# Patient Record
Sex: Male | Born: 1990
Health system: Southern US, Community
[De-identification: ages and names within clinical notes are randomized; demographics above are authoritative.]

## PROBLEM LIST (undated history)

## (undated) DIAGNOSIS — L509 Urticaria, unspecified: Secondary | ICD-10-CM

## (undated) DIAGNOSIS — J45909 Unspecified asthma, uncomplicated: Secondary | ICD-10-CM

## (undated) HISTORY — DX: Unspecified asthma, uncomplicated: J45.909

## (undated) HISTORY — DX: Urticaria, unspecified: L50.9

---

## 2018-05-15 ENCOUNTER — Encounter: Payer: Self-pay | Admitting: Family Medicine

## 2018-06-13 ENCOUNTER — Ambulatory Visit: Payer: BLUE CROSS/BLUE SHIELD | Admitting: Family Medicine

## 2018-06-13 ENCOUNTER — Encounter: Payer: Self-pay | Admitting: Family Medicine

## 2018-06-13 VITALS — BP 102/68 | HR 60 | Temp 97.9°F | Ht 66.0 in | Wt 156.0 lb

## 2018-06-13 DIAGNOSIS — Z0000 Encounter for general adult medical examination without abnormal findings: Secondary | ICD-10-CM

## 2018-06-13 DIAGNOSIS — J4520 Mild intermittent asthma, uncomplicated: Secondary | ICD-10-CM

## 2018-06-13 DIAGNOSIS — Z131 Encounter for screening for diabetes mellitus: Secondary | ICD-10-CM

## 2018-06-13 DIAGNOSIS — Z23 Encounter for immunization: Secondary | ICD-10-CM | POA: Diagnosis not present

## 2018-06-13 DIAGNOSIS — J302 Other seasonal allergic rhinitis: Secondary | ICD-10-CM | POA: Insufficient documentation

## 2018-06-13 DIAGNOSIS — Z13220 Encounter for screening for lipoid disorders: Secondary | ICD-10-CM | POA: Diagnosis not present

## 2018-06-13 LAB — CBC WITH DIFFERENTIAL/PLATELET
BASOS PCT: 1.1 % (ref 0.0–3.0)
Basophils Absolute: 0.1 10*3/uL (ref 0.0–0.1)
EOS PCT: 0.6 % (ref 0.0–5.0)
Eosinophils Absolute: 0 10*3/uL (ref 0.0–0.7)
HCT: 45.1 % (ref 39.0–52.0)
Hemoglobin: 15.2 g/dL (ref 13.0–17.0)
Lymphocytes Relative: 47.1 % — ABNORMAL HIGH (ref 12.0–46.0)
Lymphs Abs: 2.4 10*3/uL (ref 0.7–4.0)
MCHC: 33.6 g/dL (ref 30.0–36.0)
MCV: 90.7 fl (ref 78.0–100.0)
MONOS PCT: 10.5 % (ref 3.0–12.0)
Monocytes Absolute: 0.5 10*3/uL (ref 0.1–1.0)
NEUTROS ABS: 2.1 10*3/uL (ref 1.4–7.7)
Neutrophils Relative %: 40.7 % — ABNORMAL LOW (ref 43.0–77.0)
Platelets: 332 10*3/uL (ref 150.0–400.0)
RBC: 4.97 Mil/uL (ref 4.22–5.81)
RDW: 13.5 % (ref 11.5–15.5)
WBC: 5 10*3/uL (ref 4.0–10.5)

## 2018-06-13 LAB — BASIC METABOLIC PANEL
BUN: 14 mg/dL (ref 6–23)
CALCIUM: 10.1 mg/dL (ref 8.4–10.5)
CHLORIDE: 103 meq/L (ref 96–112)
CO2: 31 mEq/L (ref 19–32)
CREATININE: 1.19 mg/dL (ref 0.40–1.50)
GFR: 94.85 mL/min (ref >60.00)
Glucose, Bld: 91 mg/dL (ref 70–99)
Potassium: 4.7 mEq/L (ref 3.5–5.1)
Sodium: 140 mEq/L (ref 135–145)

## 2018-06-13 LAB — LIPID PANEL
CHOL/HDL RATIO: 3
Cholesterol: 155 mg/dL (ref 0–200)
HDL: 60.9 mg/dL (ref >39.00)
LDL Cholesterol: 86 mg/dL (ref 0–99)
NONHDL: 93.78
Triglycerides: 38 mg/dL (ref 0.0–149.0)
VLDL: 7.6 mg/dL (ref 0.0–40.0)

## 2018-06-13 LAB — HEMOGLOBIN A1C: Hgb A1c MFr Bld: 5.4 % (ref 4.6–6.5)

## 2018-06-13 NOTE — Progress Notes (Signed)
Subjective:     Charles Harris is a 27 y.o. male and is here for a comprehensive physical exam. The patient reports no problems.  Pt has a h/o asthma, well controlled, symptoms more in the spring, uses Symbicort.  Also endorses seasonal allergies, may take OTC Zyrtec.  Social hx: Pt is single.  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  Pt endorses social EtOH use.  Pt denies tobacco and drug use.  Last eye exam 12/2017.  Pt notes he had a concussion 2 years ago flag football.  Family medical history: Mom-AAW Dad-alive, diabetes, HTN MGM-alive, arthritis MGM-alive, HTN  Social History   Socioeconomic History  . Marital status: Single    Spouse name: Not on file  . Number of children: Not on file  . Years of education: Not on file  . Highest education level: Not on file  Occupational History  . Not on file  Social Needs  . Financial resource strain: Not on file  . Food insecurity:    Worry: Not on file    Inability: Not on file  . Transportation needs:    Medical: Not on file    Non-medical: Not on file  Tobacco Use  . Smoking status: Never Smoker  . Smokeless tobacco: Never Used  Substance and Sexual Activity  . Alcohol use: Yes    Frequency: Never  . Drug use: Never  . Sexual activity: Yes  Lifestyle  . Physical activity:    Days per week: Not on file    Minutes per session: Not on file  . Stress: Not on file  Relationships  . Social connections:    Talks on phone: Not on file    Gets together: Not on file    Attends religious service: Not on file    Active member of club or organization: Not on file    Attends meetings of clubs or organizations: Not on file    Relationship status: Not on file  . Intimate partner violence:    Fear of current or ex partner: Not on file    Emotionally abused: Not on file    Physically abused: Not on file    Forced sexual activity: Not on file  Other Topics Concern  . Not on file  Social  History Narrative  . Not on file   Health Maintenance  Topic Date Due  . HIV Screening  03/17/2007  . TETANUS/TDAP  03/17/2011  . INFLUENZA VACCINE  03/13/2018    The following portions of the patient's history were reviewed and updated as appropriate: allergies, current medications, past family history, past medical history, past social history, past surgical history and problem list.  Review of Systems A comprehensive review of systems was negative.   Objective:    BP 102/68 (BP Location: Left Arm, Patient Position: Sitting, Cuff Size: Large)   Pulse 60   Temp 97.9 F (36.6 C) (Oral)   Ht 5\' 6"  (1.676 m)   Wt 156 lb (70.8 kg)   SpO2 98%   BMI 25.18 kg/m  General appearance: alert, cooperative, appears stated age and no distress Head: Normocephalic, without obvious abnormality, atraumatic Eyes: conjunctivae/corneas clear. PERRL, EOM's intact. Fundi benign. Ears: normal TM's and external ear canals both ears Nose: Nares normal. Septum midline. Mucosa normal. No drainage or sinus tenderness. Throat: lips, mucosa, and tongue normal; teeth and gums normal Neck: no adenopathy, no carotid bruit, no JVD, supple, symmetrical, trachea midline and thyroid  not enlarged, symmetric, no tenderness/mass/nodules Lungs: clear to auscultation bilaterally Heart: regular rate and rhythm, S1, S2 normal, no murmur, click, rub or gallop Abdomen: soft, non-tender; bowel sounds normal; no masses,  no organomegaly Extremities: extremities normal, atraumatic, no cyanosis or edema Skin: Skin color, texture, turgor normal. No rashes or lesions Neurologic: Alert and oriented X 3, normal strength and tone. Normal symmetric reflexes. Normal coordination and gait    Assessment:    Healthy male exam.      Plan:     Anticipatory guidance given including wearing seatbelts, smoke detectors in the home, increasing physical activity, increasing p.o. intake of water and vegetables. -will obtain labs -given  handout See After Visit Summary for Counseling Recommendations    Asthma -Continue Symbicort  Seasonal allergies -Continue Zyrtec prn  Influenza vaccine given this visit.  F/u prn  Abbe Amsterdam, MD

## 2018-06-13 NOTE — Addendum Note (Signed)
Addended by: Carola Rhine on: 06/13/2018 02:59 PM   Modules accepted: Orders

## 2018-06-13 NOTE — Patient Instructions (Signed)
Preventive Care 18-39 Years, Male Preventive care refers to lifestyle choices and visits with your health care provider that can promote health and wellness. What does preventive care include?  A yearly physical exam. This is also called an annual well check.  Dental exams once or twice a year.  Routine eye exams. Ask your health care provider how often you should have your eyes checked.  Personal lifestyle choices, including: ? Daily care of your teeth and gums. ? Regular physical activity. ? Eating a healthy diet. ? Avoiding tobacco and drug use. ? Limiting alcohol use. ? Practicing safe sex. What happens during an annual well check? The services and screenings done by your health care provider during your annual well check will depend on your age, overall health, lifestyle risk factors, and family history of disease. Counseling Your health care provider may ask you questions about your:  Alcohol use.  Tobacco use.  Drug use.  Emotional well-being.  Home and relationship well-being.  Sexual activity.  Eating habits.  Work and work Statistician.  Screening You may have the following tests or measurements:  Height, weight, and BMI.  Blood pressure.  Lipid and cholesterol levels. These may be checked every 5 years starting at age 34.  Diabetes screening. This is done by checking your blood sugar (glucose) after you have not eaten for a while (fasting).  Skin check.  Hepatitis C blood test.  Hepatitis B blood test.  Sexually transmitted disease (STD) testing.  Discuss your test results, treatment options, and if necessary, the need for more tests with your health care provider. Vaccines Your health care provider may recommend certain vaccines, such as:  Influenza vaccine. This is recommended every year.  Tetanus, diphtheria, and acellular pertussis (Tdap, Td) vaccine. You may need a Td booster every 10 years.  Varicella vaccine. You may need this if you  have not been vaccinated.  HPV vaccine. If you are 23 or younger, you may need three doses over 6 months.  Measles, mumps, and rubella (MMR) vaccine. You may need at least one dose of MMR.You may also need a second dose.  Pneumococcal 13-valent conjugate (PCV13) vaccine. You may need this if you have certain conditions and have not been vaccinated.  Pneumococcal polysaccharide (PPSV23) vaccine. You may need one or two doses if you smoke cigarettes or if you have certain conditions.  Meningococcal vaccine. One dose is recommended if you are age 65-21 years and a first-year college student living in a residence hall, or if you have one of several medical conditions. You may also need additional booster doses.  Hepatitis A vaccine. You may need this if you have certain conditions or if you travel or work in places where you may be exposed to hepatitis A.  Hepatitis B vaccine. You may need this if you have certain conditions or if you travel or work in places where you may be exposed to hepatitis B.  Haemophilus influenzae type b (Hib) vaccine. You may need this if you have certain risk factors.  Talk to your health care provider about which screenings and vaccines you need and how often you need them. This information is not intended to replace advice given to you by your health care provider. Make sure you discuss any questions you have with your health care provider. Document Released: 09/25/2001 Document Revised: 04/18/2016 Document Reviewed: 05/31/2015 Elsevier Interactive Patient Education  [employer].

## 2019-01-12 ENCOUNTER — Other Ambulatory Visit: Payer: Self-pay

## 2019-01-12 ENCOUNTER — Encounter: Payer: Self-pay | Admitting: Family Medicine

## 2019-01-12 ENCOUNTER — Ambulatory Visit (INDEPENDENT_AMBULATORY_CARE_PROVIDER_SITE_OTHER): Payer: BLUE CROSS/BLUE SHIELD | Admitting: Family Medicine

## 2019-01-12 DIAGNOSIS — M79672 Pain in left foot: Secondary | ICD-10-CM

## 2019-01-12 DIAGNOSIS — R600 Localized edema: Secondary | ICD-10-CM | POA: Diagnosis not present

## 2019-01-12 NOTE — Progress Notes (Signed)
Virtual Visit via Video Note  I connected with Charles Harris on 01/12/19 at  1:30 PM EDT by a video enabled telemedicine application and verified that I am speaking with the correct person using two identifiers.  Location patient: home Location provider:work or home office Persons participating in the virtual visit: patient, provider  I discussed the limitations of evaluation and management by telemedicine and the availability of in person appointments. The patient expressed understanding and agreed to proceed.   HPI: While exercising yesterday, the metal part of an exercise/resistance band slipped and hit the top of pt's L foot.  Pt notes edema to midfoot.  Has pain radiating from area up leg.  Able to bear some weight on foot but not full weight.  Ambulating with a limp.    ROS: See pertinent positives and negatives per HPI.  Past Medical History:  Diagnosis Date  . Asthma     No past surgical history on file.  Family History  Problem Relation Age of Onset  . Diabetes Father   . Hypertension Father   . Arthritis Maternal Grandmother   . Hypertension Maternal Grandfather     SOCIAL HX: Pt is a Education officer, community.  Taking precautions at work.  States has adequate PPE.  No current outpatient medications on file.  EXAM:  VITALS per patient if applicable:  RR between 12-20 bpm  GENERAL: alert, oriented, appears well and in no acute distress  HEENT: atraumatic, conjunctiva clear, no obvious abnormalities on inspection of external nose and ears  NECK: normal movements of the head and neck  LUNGS: on inspection no signs of respiratory distress, breathing rate appears normal, no obvious gross SOB, gasping or wheezing  CV: no obvious cyanosis  MS:  TTP of L midfoot, mild edema.  Moves all visible extremities without noticeable abnormality    PSYCH/NEURO: pleasant and cooperative, no obvious depression or anxiety, speech and thought processing grossly intact  ASSESSMENT AND PLAN:   Discussed the following assessment and plan:  Left foot pain  -discussed elevation, ice, NSAIDs, rest -will obtain xray. - Plan: DG Foot Complete Left -will proceed accordingly based on imaging.  Edema of left foot  -ice - Plan: DG Foot Complete Left  F/u prn   I discussed the assessment and treatment plan with the patient. The patient was provided an opportunity to ask questions and all were answered. The patient agreed with the plan and demonstrated an understanding of the instructions.   The patient was advised to call back or seek an in-person evaluation if the symptoms worsen or if the condition fails to improve as anticipated.    Deeann Saint, MD

## 2019-01-13 ENCOUNTER — Ambulatory Visit (INDEPENDENT_AMBULATORY_CARE_PROVIDER_SITE_OTHER): Payer: BC Managed Care – PPO

## 2019-01-13 DIAGNOSIS — M25572 Pain in left ankle and joints of left foot: Secondary | ICD-10-CM | POA: Diagnosis not present

## 2019-01-13 DIAGNOSIS — M79672 Pain in left foot: Secondary | ICD-10-CM

## 2019-01-13 DIAGNOSIS — R600 Localized edema: Secondary | ICD-10-CM

## 2019-01-13 DIAGNOSIS — S99922A Unspecified injury of left foot, initial encounter: Secondary | ICD-10-CM | POA: Diagnosis not present

## 2019-01-19 DIAGNOSIS — Z20828 Contact with and (suspected) exposure to other viral communicable diseases: Secondary | ICD-10-CM | POA: Diagnosis not present

## 2019-03-18 DIAGNOSIS — Z20828 Contact with and (suspected) exposure to other viral communicable diseases: Secondary | ICD-10-CM | POA: Diagnosis not present

## 2019-06-23 ENCOUNTER — Encounter: Payer: Self-pay | Admitting: Family Medicine

## 2019-07-22 ENCOUNTER — Other Ambulatory Visit: Payer: Self-pay

## 2019-07-23 ENCOUNTER — Encounter: Payer: BC Managed Care – PPO | Admitting: Family Medicine

## 2019-11-04 ENCOUNTER — Other Ambulatory Visit: Payer: Self-pay

## 2019-11-05 ENCOUNTER — Encounter: Payer: Self-pay | Admitting: Family Medicine

## 2019-11-05 ENCOUNTER — Ambulatory Visit (INDEPENDENT_AMBULATORY_CARE_PROVIDER_SITE_OTHER): Payer: BC Managed Care – PPO | Admitting: Family Medicine

## 2019-11-05 VITALS — BP 118/80 | HR 62 | Temp 97.7°F | Wt 127.0 lb

## 2019-11-05 DIAGNOSIS — R238 Other skin changes: Secondary | ICD-10-CM

## 2019-11-05 DIAGNOSIS — Z0000 Encounter for general adult medical examination without abnormal findings: Secondary | ICD-10-CM

## 2019-11-05 DIAGNOSIS — R195 Other fecal abnormalities: Secondary | ICD-10-CM | POA: Diagnosis not present

## 2019-11-05 DIAGNOSIS — Z13220 Encounter for screening for lipoid disorders: Secondary | ICD-10-CM

## 2019-11-05 DIAGNOSIS — Z87820 Personal history of traumatic brain injury: Secondary | ICD-10-CM | POA: Diagnosis not present

## 2019-11-05 DIAGNOSIS — Z131 Encounter for screening for diabetes mellitus: Secondary | ICD-10-CM

## 2019-11-05 NOTE — Progress Notes (Signed)
Subjective:     Charles Harris is a 29 y.o. male and is here for a comprehensive physical exam. The patient reports problems - loose stools.  Pt notes intermittent episodes of loose stools.  May have 3-4 stools qod.  Pt denies changes in diet, abdominal cramping, n/v, bloating, changes in bowel with anxiety.  Eating oatmeal every morning.  Pt notes may have a smoothie with frozen fruit and vegetables.  Pt has allergies to fresh fruits-cause throat to feel itchy.    H/o concussion in 2018.  Occured while playing flag football.  States he ran into his teammate's shoulder as they were trying to go after the ball.  Pt follow-up with a neuro optometrist at the time of the incident.  Since then pt denies changes in vision, headaches, other residual effects.  Pt also mentions history of fingers changing colors in the cold.  Pt denies pain in fingers during cold temperatures, sensitivity to cold.  No recent issues.  Social History   Socioeconomic History  . Marital status: Single    Spouse name: Not on file  . Number of children: Not on file  . Years of education: Not on file  . Highest education level: Not on file  Occupational History  . Not on file  Tobacco Use  . Smoking status: Never Smoker  . Smokeless tobacco: Never Used  Substance and Sexual Activity  . Alcohol use: Yes  . Drug use: Never  . Sexual activity: Yes  Other Topics Concern  . Not on file  Social History Narrative  . Not on file   Social Determinants of Health   Financial Resource Strain:   . Difficulty of Paying Living Expenses:   Food Insecurity:   . Worried About Programme researcher, broadcasting/film/video in the Last Year:   . Barista in the Last Year:   Transportation Needs:   . Freight forwarder (Medical):   Marland Kitchen Lack of Transportation (Non-Medical):   Physical Activity:   . Days of Exercise per Week:   . Minutes of Exercise per Session:   Stress:   . Feeling of Stress :   Social Connections:   . Frequency of  Communication with Friends and Family:   . Frequency of Social Gatherings with Friends and Family:   . Attends Religious Services:   . Active Member of Clubs or Organizations:   . Attends Banker Meetings:   Marland Kitchen Marital Status:   Intimate Partner Violence:   . Fear of Current or Ex-Partner:   . Emotionally Abused:   Marland Kitchen Physically Abused:   . Sexually Abused:    Health Maintenance  Topic Date Due  . HIV Screening  Never done  . INFLUENZA VACCINE  03/14/2019  . TETANUS/TDAP  04/06/2026    The following portions of the patient's history were reviewed and updated as appropriate: allergies, current medications, past family history, past medical history, past social history, past surgical history and problem list.  Review of Systems Pertinent items noted in HPI and remainder of comprehensive ROS otherwise negative.   Objective:    BP 118/80 (BP Location: Left Arm, Patient Position: Sitting, Cuff Size: Normal)   Pulse 62   Temp 97.7 F (36.5 C) (Temporal)   Wt 127 lb (57.6 kg)   SpO2 97%   BMI 20.50 kg/m  General appearance: alert, cooperative and no distress Head: Normocephalic, without obvious abnormality, atraumatic Eyes: conjunctivae/corneas clear. PERRL, EOM's intact. Fundi benign. Ears: normal TM's and external  ear canals both ears Nose: Nares normal. Septum midline. Mucosa normal. No drainage or sinus tenderness. Throat: lips, mucosa, and tongue normal; teeth and gums normal Neck: no adenopathy, no carotid bruit, no JVD, supple, symmetrical, trachea midline and thyroid not enlarged, symmetric, no tenderness/mass/nodules Lungs: clear to auscultation bilaterally Heart: regular rate and rhythm, S1, S2 normal, no murmur, click, rub or gallop Abdomen: soft, nondistended, TTP in LUQ and LLQ, no masses or organomegaly. Extremities: extremities normal, atraumatic, no cyanosis or edema Pulses: 2+ and symmetric Skin: Skin color, texture, turgor normal. No rashes or  lesions Lymph nodes: Cervical, supraclavicular, and axillary nodes normal. Neurologic: Alert and oriented X 3, normal strength and tone. Normal symmetric reflexes. Normal coordination and gait    Assessment:    Healthy male exam with recent episodes of loose stools     Plan:     Anticipatory guidance given including wearing seatbelts, smoke detectors in the home, increasing physical activity, increasing p.o. intake of water and vegetables. -will obtain labs.  Declines STI testing. -given handout -next CPE in 1 yr See After Visit Summary for Counseling Recommendations    Loose stools  -Discussed possible causes including increased fiber intake, gluten intolerance, IBS -Discussed trying probiotic. -Discussed low FODMAP diet.  Consider decreasing intake of oatmeal every morning. -Encouraged to keep a food diary -Follow-up in 1 month, sooner if needed - Plan: TSH, T4, Free, Comprehensive metabolic panel  Screening for diabetes mellitus  - Plan: Hemoglobin A1c  Screening for cholesterol level  - Plan: Lipid panel  History of concussion -Resolved.  No residual effects noted  Change of skin color -h/o finger color change in cold without recent issues. -less likely raynaud's phenomenon as pt without a positive response to all of the following 3 questinons: sensitivity to cold, color change when exposed to cold, and fingers turning white, blue, or both.  F/u in 1 month  Grier Mitts, MD

## 2019-11-05 NOTE — Patient Instructions (Addendum)
Preventive Care 19-28 Years Old, Male Preventive care refers to lifestyle choices and visits with your health care provider that can promote health and wellness. This includes:  A yearly physical exam. This is also called an annual well check.  Regular dental and eye exams.  Immunizations.  Screening for certain conditions.  Healthy lifestyle choices, such as eating a healthy diet, getting regular exercise, not using drugs or products that contain nicotine and tobacco, and limiting alcohol use. What can I expect for my preventive care visit? Physical exam Your health care provider will check:  Height and weight. These may be used to calculate body mass index (BMI), which is a measurement that tells if you are at a healthy weight.  Heart rate and blood pressure.  Your skin for abnormal spots. Counseling Your health care provider may ask you questions about:  Alcohol, tobacco, and drug use.  Emotional well-being.  Home and relationship well-being.  Sexual activity.  Eating habits.  Work and work Statistician. What immunizations do I need?  Influenza (flu) vaccine  This is recommended every year. Tetanus, diphtheria, and pertussis (Tdap) vaccine  You may need a Td booster every 10 years. Varicella (chickenpox) vaccine  You may need this vaccine if you have not already been vaccinated. Human papillomavirus (HPV) vaccine  If recommended by your health care provider, you may need three doses over 6 months. Measles, mumps, and rubella (MMR) vaccine  You may need at least one dose of MMR. You may also need a second dose. Meningococcal conjugate (MenACWY) vaccine  One dose is recommended if you are 45-76 years old and a Market researcher living in a residence hall, or if you have one of several medical conditions. You may also need additional booster doses. Pneumococcal conjugate (PCV13) vaccine  You may need this if you have certain conditions and were not  previously vaccinated. Pneumococcal polysaccharide (PPSV23) vaccine  You may need one or two doses if you smoke cigarettes or if you have certain conditions. Hepatitis A vaccine  You may need this if you have certain conditions or if you travel or work in places where you may be exposed to hepatitis A. Hepatitis B vaccine  You may need this if you have certain conditions or if you travel or work in places where you may be exposed to hepatitis B. Haemophilus influenzae type b (Hib) vaccine  You may need this if you have certain risk factors. You may receive vaccines as individual doses or as more than one vaccine together in one shot (combination vaccines). Talk with your health care provider about the risks and benefits of combination vaccines. What tests do I need? Blood tests  Lipid and cholesterol levels. These may be checked every 5 years starting at age 17.  Hepatitis C test.  Hepatitis B test. Screening   Diabetes screening. This is done by checking your blood sugar (glucose) after you have not eaten for a while (fasting).  Sexually transmitted disease (STD) testing. Talk with your health care provider about your test results, treatment options, and if necessary, the need for more tests. Follow these instructions at home: Eating and drinking   Eat a diet that includes fresh fruits and vegetables, whole grains, lean protein, and low-fat dairy products.  Take vitamin and mineral supplements as recommended by your health care provider.  Do not drink alcohol if your health care provider tells you not to drink.  If you drink alcohol: ? Limit how much you have to 0-2  drinks a day. ? Be aware of how much alcohol is in your drink. In the [place] one drink equals one 12 oz bottle of beer (355 mL), one 5 oz glass of wine (148 mL), or one 1 oz glass of hard liquor (44 mL). Lifestyle  Take daily care of your teeth and gums.  Stay active. Exercise for at least 30 minutes on 5 or  more days each week.  Do not use any products that contain nicotine or tobacco, such as cigarettes, e-cigarettes, and chewing tobacco. If you need help quitting, ask your health care provider.  If you are sexually active, practice safe sex. Use a condom or other form of protection to prevent STIs (sexually transmitted infections). What's next?  Go to your health care provider once a year for a well check visit.  Ask your health care provider how often you should have your eyes and teeth checked.  Stay up to date on all vaccines. This information is not intended to replace advice given to you by your health care provider. Make sure you discuss any questions you have with your health care provider. Document Revised: 07/24/2018 Document Reviewed: 07/24/2018 Elsevier Patient Education  [place]  FODMAPs (fermentable oligosaccharides, disaccharides, monosaccharides, and polyols) are sugars that are hard for some people to digest. A low-FODMAP eating plan may help some people who have bowel (intestinal) diseases to manage their symptoms. This meal plan can be complicated to follow. Work with a diet and nutrition specialist (dietitian) to make a low-FODMAP eating plan that is right for you. A dietitian can make sure that you get enough nutrition from this diet. What are tips for following this plan? Reading food labels  Check labels for hidden FODMAPs such as: ? High-fructose syrup. ? Honey. ? Agave. ? Natural fruit flavors. ? Onion or garlic powder.  Choose low-FODMAP foods that contain 3-4 grams of fiber per serving.  Check food labels for serving sizes. Eat only one serving at a time to make sure FODMAP levels stay low. Meal planning  Follow a low-FODMAP eating plan for up to 6 weeks, or as told by your health care provider or dietitian.  To follow the eating plan: 1. Eliminate high-FODMAP foods from your diet completely. 2. Gradually reintroduce  high-FODMAP foods into your diet one at a time. Most people should wait a few days after introducing one high-FODMAP food before they introduce the next high-FODMAP food. Your dietitian can recommend how quickly you may reintroduce foods. 3. Keep a daily record of what you eat and drink, and make note of any symptoms that you have after eating. 4. Review your daily record with a dietitian regularly. Your dietitian can help you identify which foods you can eat and which foods you should avoid. General tips  Drink enough fluid each day to keep your urine pale yellow.  Avoid processed foods. These often have added sugar and may be high in FODMAPs.  Avoid most dairy products, whole grains, and sweeteners.  Work with a dietitian to make sure you get enough fiber in your diet. Recommended foods Grains  Gluten-free grains, such as rice, oats, buckwheat, quinoa, corn, polenta, and millet. Gluten-free pasta, bread, or cereal. Rice noodles. Corn tortillas. Vegetables  Eggplant, zucchini, cucumber, peppers, green beans, Brussels sprouts, bean sprouts, lettuce, arugula, kale, Swiss chard, spinach, collard greens, bok choy, summer squash, potato, and tomato. Limited amounts of corn, carrot, and sweet potato. Green parts of scallions. Fruits  Bananas,  oranges, lemons, limes, blueberries, raspberries, strawberries, grapes, cantaloupe, honeydew melon, kiwi, papaya, passion fruit, and pineapple. Limited amounts of dried cranberries, banana chips, and shredded coconut. Dairy  Lactose-free milk, yogurt, and kefir. Lactose-free cottage cheese and ice cream. Non-dairy milks, such as almond, coconut, hemp, and rice milk. Yogurts made of non-dairy milks. Limited amounts of goat cheese, brie, mozzarella, parmesan, swiss, and other hard cheeses. Meats and other protein foods  Unseasoned beef, pork, poultry, or fish. Eggs. Berniece Salines. Tofu (firm) and tempeh. Limited amounts of nuts and seeds, such as almonds, walnuts,  [place] nuts, pecans, peanuts, pumpkin seeds, chia seeds, and sunflower seeds. Fats and oils  Butter-free spreads. Vegetable oils, such as olive, canola, and sunflower oil. Seasoning and other foods  Artificial sweeteners with names that do not end in "ol" such as aspartame, saccharine, and stevia. Maple syrup, white table sugar, raw sugar, brown sugar, and molasses. Fresh basil, coriander, parsley, rosemary, and thyme. Beverages  Water and mineral water. Sugar-sweetened soft drinks. Small amounts of orange juice or cranberry juice. Black and green tea. Most dry wines. Coffee. This may not be a complete list of low-FODMAP foods. Talk with your dietitian for more information. Foods to avoid Grains  Wheat, including kamut, durum, and semolina. Barley and bulgur. Couscous. Wheat-based cereals. Wheat noodles, bread, crackers, and pastries. Vegetables  Chicory root, artichoke, asparagus, cabbage, snow peas, sugar snap peas, mushrooms, and cauliflower. Onions, garlic, leeks, and the white part of scallions. Fruits  Fresh, dried, and juiced forms of apple, pear, watermelon, peach, plum, cherries, apricots, blackberries, boysenberries, figs, nectarines, and mango. Avocado. Dairy  Milk, yogurt, ice cream, and soft cheese. Cream and sour cream. Milk-based sauces. Custard. Meats and other protein foods  Fried or fatty meat. Sausage. Cashews and pistachios. Soybeans, baked beans, black beans, chickpeas, kidney beans, fava beans, navy beans, lentils, and split peas. Seasoning and other foods  Any sugar-free gum or candy. Foods that contain artificial sweeteners such as sorbitol, mannitol, isomalt, or xylitol. Foods that contain honey, high-fructose corn syrup, or agave. Bouillon, vegetable stock, beef stock, and chicken stock. Garlic and onion powder. Condiments made with onion, such as hummus, chutney, pickles, relish, salad dressing, and salsa. Tomato paste. Beverages  Chicory-based drinks.  Coffee substitutes. Chamomile tea. Fennel tea. Sweet or fortified wines such as port or sherry. Diet soft drinks made with isomalt, mannitol, maltitol, sorbitol, or xylitol. Apple, pear, and mango juice. Juices with high-fructose corn syrup. This may not be a complete list of high-FODMAP foods. Talk with your dietitian to discuss what dietary choices are best for you.  Summary  A low-FODMAP eating plan is a short-term diet that eliminates FODMAPs from your diet to help ease symptoms of certain bowel diseases.  The eating plan usually lasts up to 6 weeks. After that, high-FODMAP foods are restarted gradually, one at a time, so you can find out which may be causing symptoms.  A low-FODMAP eating plan can be complicated. It is best to work with a dietitian who has experience with this type of plan. This information is not intended to replace advice given to you by your health care provider. Make sure you discuss any questions you have with your health care provider. Document Revised: 07/12/2017 Document Reviewed: 03/26/2017 Elsevier Patient Education  2020 [place] Allergy A food allergy is an abnormal reaction to a food (food allergen) by the body's defense system (immune system). Foods that commonly cause allergies include:  Milk.  Seafood.  Eggs.  Peanuts.  Tree nuts such as pecans, walnuts, and cashews.  Wheat.  Soy. What are the causes? Food allergies happen when the immune system sees a food as harmful and releases chemicals (antibodies) to fight it. What are the signs or symptoms? Symptoms may be mild or severe. They usually start minutes after eating the food, but they can occur even a few hours later. In people with a severe allergy, symptoms can start within seconds. Mild symptoms of this condition include:  Congested nose.  Tingling in the mouth.  An itchy, red rash.  Vomiting.  Diarrhea. In people with a severe allergy, a life-threatening reaction can  occur called anaphylaxis. Get help right away if you have symptoms of anaphylaxis, such as:  Feeling warm in the face (flushed). This may include redness.  Itchy, red, swollen areas of skin (hives).  Swelling of the eyes, lips, face, mouth, tongue, or throat.  Difficulty breathing, speaking, or swallowing.  Noisy breathing (wheezing).  Dizziness or light-headedness.  Fainting.  Pain or cramping in the abdomen. How is this diagnosed? This condition may be diagnosed based on:  A physical exam.  Your medical history.  Skin tests.  Blood tests.  A food challenge test. This test involves eating the food that may be causing the allergic response while being monitored for a reaction by your health care provider.  The results of an elimination diet. The elimination diet involves removing foods from your diet and then adding them back in, one at a time.  A food diary. How is this treated? There is no cure for food allergies. Treatment focuses on preventing exposure to the food or foods you are allergic to and treating reactions if you are exposed to the food. Mild symptoms may not need treatment.  Severe reactions usually need to be treated at a hospital. Treatment may include:  Medicines that help: ? Tighten your blood vessels (epinephrine). ? Relieve itching and hives (antihistamines). ? Widen the narrow and tight airways (bronchodilators). ? Reduce swelling (corticosteroids).  Oxygen therapy to help you breathe.  IV fluids to keep you hydrated. After a severe reaction, you may be given rescue medicines, such as:  An anaphylaxis kit.  An epinephrine injection, commonly called an auto-injector "pen" (pre-filled automatic epinephrine injection device). Your health care provider may teach you how to use these if you are accidentally exposed to an allergen. Follow these instructions at home: If you have a potential allergy:  Follow the elimination diet as told by your  health care provider.  Keep a food diary as told by your health care provider. Every day, write down: ? What you eat and drink and when. ? What symptoms you have and when. If you have a severe allergy:   Wear a medical alert bracelet or necklace that describes your allergy.  Carry your anaphylaxis kit or an auto-injector pen with you at all times. Use them as told by your health care provider.  Make sure that you, your family members, and your employer know: ? The signs of anaphylaxis. ? How to use an anaphylaxis kit. ? How to use an auto-injector pen.  If you think that you are having an anaphylactic reaction, use your auto-injector pen or anaphylaxis kit.  Replace your auto-injector pen immediately after use, in case you have another reaction.  Get medical care after use your auto-injector pen. This is important because you can have a delayed, life-threatening reaction after taking the medicine (rebound anaphylaxis). General instructions  Avoid the foods that  you are allergic to.  Read food labels before you eat packaged items. Look for ingredients that you are allergic to.  When you are at a restaurant, tell your server that you have an allergy. If you are unsure whether a meal has an ingredient that you are allergic to, ask your server.  Take over-the-counter and prescription medicines only as told by your health care provider. ? Do not drive until the medicine has worn off, unless your health care provider gives you approval.  Inform all health care providers that you have a food allergy.  If you think that you might be allergic to something else, talk with your health care provider. Do not eat a food to see if you are allergic to it without talking with your health care provider first. Contact a health care provider if you:  Have symptoms that do not go away within 2 days.  Have symptoms that get worse.  Have new symptoms. Get help right away if you have symptoms of  anaphylaxis:  Flushed skin.  Hives.  Swelling of the eyes, lips, face, mouth, tongue, or throat.  Difficulty breathing, speaking, or swallowing.  Wheezing.  Dizziness or light-headedness.  Fainting.  Pain or cramping in the abdomen. These symptoms may represent a serious problem that is an emergency. Do not wait to see if the symptoms will go away. Use your auto-injector pen or anaphylaxis kit as you have been told. Get medical help right away. Call your local emergency services (911 in the [place] Do not drive yourself to the hospital. If you needed to use an auto-injector pen, you need more medical care even if the medicine seems to be helping. This is important because anaphylaxis may happen again within 72 hours. Summary  A food allergy is an abnormal reaction to a food (food allergen) by the body's defense system (immune system).  There is no cure for food allergies. Treatment focuses on preventing exposure to the food or foods you are allergic to and treating reactions if you are exposed to the food.  Wear a medical alert bracelet or necklace that describes your allergy.  If you have symptoms of anaphylaxis, use your auto-injector pen or anaphylaxis kit as you have been instructed, and get medical help right away. This information is not intended to replace advice given to you by your health care provider. Make sure you discuss any questions you have with your health care provider. Document Revised: 07/30/2017 Document Reviewed: 07/30/2017 Elsevier Patient Education  [place].

## 2019-11-06 DIAGNOSIS — Z87820 Personal history of traumatic brain injury: Secondary | ICD-10-CM | POA: Insufficient documentation

## 2019-11-06 LAB — COMPREHENSIVE METABOLIC PANEL
ALT: 12 U/L (ref 0–53)
AST: 20 U/L (ref 0–37)
Albumin: 4.7 g/dL (ref 3.5–5.2)
Alkaline Phosphatase: 71 U/L (ref 39–117)
BUN: 11 mg/dL (ref 6–23)
CO2: 31 mEq/L (ref 19–32)
Calcium: 10.1 mg/dL (ref 8.4–10.5)
Chloride: 101 mEq/L (ref 96–112)
Creatinine, Ser: 1.11 mg/dL (ref 0.40–1.50)
GFR: 95.69 mL/min (ref >60.00)
Glucose, Bld: 86 mg/dL (ref 70–99)
Potassium: 4.3 mEq/L (ref 3.5–5.1)
Sodium: 136 mEq/L (ref 135–145)
Total Bilirubin: 0.8 mg/dL (ref 0.2–1.2)
Total Protein: 7.4 g/dL (ref 6.0–8.3)

## 2019-11-06 LAB — CBC WITH DIFFERENTIAL/PLATELET
Basophils Absolute: 0 10*3/uL (ref 0.0–0.1)
Basophils Relative: 0.7 % (ref 0.0–3.0)
Eosinophils Absolute: 0 10*3/uL (ref 0.0–0.7)
Eosinophils Relative: 0.9 % (ref 0.0–5.0)
HCT: 43.5 % (ref 39.0–52.0)
Hemoglobin: 14.9 g/dL (ref 13.0–17.0)
Lymphocytes Relative: 46.7 % — ABNORMAL HIGH (ref 12.0–46.0)
Lymphs Abs: 2.3 10*3/uL (ref 0.7–4.0)
MCHC: 34.3 g/dL (ref 30.0–36.0)
MCV: 90.9 fl (ref 78.0–100.0)
Monocytes Absolute: 0.5 10*3/uL (ref 0.1–1.0)
Monocytes Relative: 10.2 % (ref 3.0–12.0)
Neutro Abs: 2 10*3/uL (ref 1.4–7.7)
Neutrophils Relative %: 41.5 % — ABNORMAL LOW (ref 43.0–77.0)
Platelets: 295 10*3/uL (ref 150.0–400.0)
RBC: 4.79 Mil/uL (ref 4.22–5.81)
RDW: 13.4 % (ref 11.5–15.5)
WBC: 4.9 10*3/uL (ref 4.0–10.5)

## 2019-11-06 LAB — LIPID PANEL
Cholesterol: 160 mg/dL (ref 0–200)
HDL: 59.2 mg/dL (ref >39.00)
LDL Cholesterol: 92 mg/dL (ref 0–99)
NonHDL: 101.2
Total CHOL/HDL Ratio: 3
Triglycerides: 48 mg/dL (ref 0.0–149.0)
VLDL: 9.6 mg/dL (ref 0.0–40.0)

## 2019-11-06 LAB — HEMOGLOBIN A1C: Hgb A1c MFr Bld: 5.4 % (ref 4.6–6.5)

## 2019-11-06 LAB — T4, FREE: Free T4: 0.92 ng/dL (ref 0.60–1.60)

## 2019-11-06 LAB — TSH: TSH: 0.95 u[IU]/mL (ref 0.35–4.50)

## 2020-01-27 ENCOUNTER — Other Ambulatory Visit: Payer: Self-pay

## 2020-01-28 ENCOUNTER — Encounter: Payer: Self-pay | Admitting: Family Medicine

## 2020-01-28 ENCOUNTER — Ambulatory Visit (INDEPENDENT_AMBULATORY_CARE_PROVIDER_SITE_OTHER): Payer: BC Managed Care – PPO | Admitting: Family Medicine

## 2020-01-28 VITALS — BP 120/78 | HR 64 | Temp 98.2°F | Wt 152.0 lb

## 2020-01-28 DIAGNOSIS — G5602 Carpal tunnel syndrome, left upper limb: Secondary | ICD-10-CM | POA: Diagnosis not present

## 2020-01-28 MED ORDER — PREDNISONE 10 MG PO TABS: ORAL_TABLET | ORAL | 0 refills | Status: DC

## 2020-01-28 NOTE — Progress Notes (Signed)
Subjective:    Patient ID: Charles Harris, male    DOB: September 03, 1990, 29 y.o.   MRN: [id number]  No chief complaint on file.   HPI Patient was seen today for ongoing concern.  Pt endorse L wrist pain with numbness and tingling in L hand x 3 wks.  Pt is a Education officer, community. endorses repetitive motions at work including typing and while working out.  Pt denies edema or erythema of hand or wrist.  Pt tried ibuprofen and a wrist splint.  Pt is L handed.  Past Medical History:  Diagnosis Date  . Asthma     Allergies  Allergen Reactions  . Fruit & Vegetable Daily [Nutritional Supplements]     SOME FRUITS, STRAWBERRY,BANANAS, APPLES    ROS General: Denies fever, chills, night sweats, changes in weight, changes in appetite HEENT: Denies headaches, ear pain, changes in vision, rhinorrhea, sore throat CV: Denies CP, palpitations, SOB, orthopnea Pulm: Denies SOB, cough, wheezing GI: Denies abdominal pain, nausea, vomiting, diarrhea, constipation GU: Denies dysuria, hematuria, frequency, vaginal discharge Msk: Denies muscle cramps, joint pains  +L wrist pain Neuro: Denies weakness   +numbness, tingling in L hand  Skin: Denies rashes, bruising Psych: Denies depression, anxiety, hallucinations      Objective:    Blood pressure 120/78, pulse 64, temperature 98.2 F (36.8 C), temperature source Temporal, weight 152 lb (68.9 kg), SpO2 98 %.  Gen. Pleasant, well-nourished, in no distress, normal affect   HEENT: [place]/AT, face symmetric, no scleral icterus, PERRLA, EOMI, nares patent without drainage Lungs: no accessory muscle use, CTAB, no wheezes or rales Cardiovascular: RRR, no peripheral edema Musculoskeletal: TTP of L wrist.  +Tinnel's and Phalen's of L wrist.  Tapping on b/l medial and lateral epicondyles causes numbness and tingling in hand.  No deformities, no cyanosis or clubbing, normal tone.  Grip strength 5/5, but causes discomfort. Neuro:  A&Ox3, CN II-XII intact, normal gait Skin:  Warm, no  lesions/ rash  Wt Readings from Last 3 Encounters:  11/05/19 127 lb (57.6 kg)  06/13/18 156 lb (70.8 kg)    Lab Results  Component Value Date   WBC 4.9 11/05/2019   HGB 14.9 11/05/2019   HCT 43.5 11/05/2019   PLT 295.0 11/05/2019   GLUCOSE 86 11/05/2019   CHOL 160 11/05/2019   TRIG 48.0 11/05/2019   HDL 59.20 11/05/2019   LDLCALC 92 11/05/2019   ALT 12 11/05/2019   AST 20 11/05/2019   NA 136 11/05/2019   K 4.3 11/05/2019   CL 101 11/05/2019   CREATININE 1.11 11/05/2019   BUN 11 11/05/2019   CO2 31 11/05/2019   TSH 0.95 11/05/2019   HGBA1C 5.4 11/05/2019    Assessment/Plan:  Carpal tunnel syndrome of left wrist  -discussed supportive care wrist exercises, NSAIDs, ice, vitamin B6, Biofreeze, Tiger balm, wrist splint at night, and ergonomic modifications to workspace -Discussed further treatments including steroid injection and carpal tunnel release -Also discussed EMG/NCS.  Pt wishes to wait at this time -Given handouts -We will start prednisone taper - Plan: predniSONE (DELTASONE) 10 MG tablet  F/u prn  Abbe [place], MD

## 2020-01-28 NOTE — Patient Instructions (Addendum)
Ice, vitamin B6 500 mg daily, wearing the wrist splint, and exercises can help with carpal tunnel.   Carpal Tunnel Syndrome  Carpal tunnel syndrome is a condition that causes pain in your hand and arm. The carpal tunnel is a narrow area located on the palm side of your wrist. Repeated wrist motion or certain diseases may cause swelling within the tunnel. This swelling pinches the main nerve in the wrist (median nerve). What are the causes? This condition may be caused by:  Repeated wrist motions.  Wrist injuries.  Arthritis.  A cyst or tumor in the carpal tunnel.  Fluid buildup during pregnancy. Sometimes the cause of this condition is not known. What increases the risk? The following factors may make you more likely to develop this condition:  Having a job, such as being a Haematologist, that requires you to repeatedly move your wrist in the same motion.  Being a woman.  Having certain conditions, such as: ? Diabetes. ? Obesity. ? An underactive thyroid (hypothyroidism). ? Kidney failure. What are the signs or symptoms? Symptoms of this condition include:  A tingling feeling in your fingers, especially in your thumb, index, and middle fingers.  Tingling or numbness in your hand.  An aching feeling in your entire arm, especially when your wrist and elbow are bent for a long time.  Wrist pain that goes up your arm to your shoulder.  Pain that goes down into your palm or fingers.  A weak feeling in your hands. You may have trouble grabbing and holding items. Your symptoms may feel worse during the night. How is this diagnosed? This condition is diagnosed with a medical history and physical exam. You may also have tests, including:  Electromyogram (EMG). This test measures electrical signals sent by your nerves into the muscles.  Nerve conduction study. This test measures how well electrical signals pass through your nerves.  Imaging tests, such as X-rays,  ultrasound, and MRI. These tests check for possible causes of your condition. How is this treated? This condition may be treated with:  Lifestyle changes. It is important to stop or change the activity that caused your condition.  Doing exercise and activities to strengthen your muscles and bones (physical therapy).  Learning how to use your hand again after diagnosis (occupational therapy).  Medicines for pain and inflammation. This may include medicine that is injected into your wrist.  A wrist splint.  Surgery. Follow these instructions at home: If you have a splint:  Wear the splint as told by your health care provider. Remove it only as told by your health care provider.  Loosen the splint if your fingers tingle, become numb, or turn cold and blue.  Keep the splint clean.  If the splint is not waterproof: ? Do not let it get wet. ? Cover it with a watertight covering when you take a bath or shower. Managing pain, stiffness, and swelling   If directed, put ice on the painful area: ? If you have a removable splint, remove it as told by your health care provider. ? Put ice in a plastic bag. ? Place a towel between your skin and the bag. ? Leave the ice on for 20 minutes, 2-3 times per day. General instructions  Take over-the-counter and prescription medicines only as told by your health care provider.  Rest your wrist from any activity that may be causing your pain. If your condition is work related, talk with your employer about changes that  can be made, such as getting a wrist pad to use while typing.  Do any exercises as told by your health care provider, physical therapist, or occupational therapist.  Keep all follow-up visits as told by your health care provider. This is important. Contact a health care provider if:  You have new symptoms.  Your pain is not controlled with medicines.  Your symptoms get worse. Get help right away if:  You have severe  numbness or tingling in your wrist or hand. Summary  Carpal tunnel syndrome is a condition that causes pain in your hand and arm.  It is usually caused by repeated wrist motions.  Lifestyle changes and medicines are used to treat carpal tunnel syndrome. Surgery may be recommended.  Follow your health care provider's instructions about wearing a splint, resting from activity, keeping follow-up visits, and calling for help. This information is not intended to replace advice given to you by your health care provider. Make sure you discuss any questions you have with your health care provider. Document Revised: 12/06/2017 Document Reviewed: 12/06/2017 Elsevier Patient Education  2020 [employer]  Preventing Carpal Tunnel Syndrome  Carpal tunnel syndrome is a condition that causes pain, numbness, and weakness in the wrist, hand, and fingers. The carpal tunnel is a narrow, hollow space in the wrist. Tendons and one of the main nerves in the hand (median nerve) pass through the carpal tunnel. The median nerve supplies feeling to the thumb and the first three fingers. It also supplies the muscles at the base of the thumb. Carpal tunnel syndrome happens when the median nerve gets squeezed in the area where it passes through the carpal tunnel. In some cases, it may not be possible to prevent carpal tunnel syndrome. However, you can take steps to relieve pressure on your wrist and reduce your risk of developing this condition. How can this condition affect me? Carpal tunnel syndrome can affect your ability to do jobs or activities that involve hand, wrist, and finger action. It can cause symptoms such as:  Pain in the wrist, hand, and fingers.  Burning, tingling, or numbness in the affected area.  A weak feeling in your hands. You may have trouble grabbing and holding items. Symptoms may get worse over time. For some people, symptoms get worse at night. What can increase my risk? The following  factors may make you more likely to develop this condition:  Having a job that requires you to repeatedly move your wrist or requires you to use tools that vibrate. This may include jobs that involve using computers, working on an [employer], or working with power tools such as Radiographer, therapeutic.  Being a woman.  Having a family history of the condition.  Having certain conditions, such as: ? Diabetes. ? Pregnancy. ? Obesity. ? Thyroid disease. ? Rheumatoid arthritis. What actions can I take to help prevent this condition?      Avoid making repetitive hand and wrist motions that cause your wrist to get stiff or painful.  Take frequent breaks if you use your hands and wrists for many hours at a time.  Stretch your hands and fingers often to get blood flowing and relieve tension.  Keep your wrists in the natural position when using a computer keyboard or mouse. Do not bend your wrists downward or sideways.  If you use your hands and wrists for many hours at work, make changes to your work space to ease pressure on your wrists. You may want to  use: ? A padded wrist rest for computer work. ? A slanted computer keyboard. ? Hand tools with padded handles to reduce vibrations.  Consider wearing a wrist brace. This will not prevent carpal tunnel syndrome but may keep it from getting worse. A wrist brace reduces bending and stress.  Closely manage any medical conditions you have that can put you at risk for carpal tunnel syndrome. Have your blood sugar checked to make sure you are not developing diabetes. If you have diabetes, work with your health care provider to keep your blood sugar under control. Where to find more information  [employer] [facility] Neu[facility] Disorders and Stroke: [email or web address]  [place] [facility]: [email or web address] Contact a health care provider if:  You have numbness or tingling in your wrist, hand, or fingers.  You have pain or a  burning sensation in your wrist, hand, or fingers.  Pain, tingling, or burning wakes you up at night.  Your hand becomes weak and clumsy.  You frequently drop objects.  You are unable to use your wrists and hands without pain. Summary  Carpal tunnel syndrome is a condition that causes pain, numbness, and weakness in the wrist, hand, and fingers.  You can take steps to relieve pressure on your wrist and reduce your risk of developing this condition.  Avoid making repetitive hand and wrist motions that cause your wrist to get stiff or painful.  If you use your hands and wrists for many hours at work, you may want to make changes to your work space to ease pressure on your wrists.  Take frequent breaks to stretch your hands and fingers. This information is not intended to replace advice given to you by your health care provider. Make sure you discuss any questions you have with your health care provider. Document Revised: 12/12/2017 Document Reviewed: 12/12/2017 Elsevier Patient Education  [place].

## 2020-02-10 DIAGNOSIS — Z20822 Contact with and (suspected) exposure to covid-19: Secondary | ICD-10-CM | POA: Diagnosis not present

## 2020-03-29 DIAGNOSIS — Z20828 Contact with and (suspected) exposure to other viral communicable diseases: Secondary | ICD-10-CM | POA: Diagnosis not present

## 2020-04-09 DIAGNOSIS — Z03818 Encounter for observation for suspected exposure to other biological agents ruled out: Secondary | ICD-10-CM | POA: Diagnosis not present

## 2020-04-26 ENCOUNTER — Other Ambulatory Visit: Payer: Self-pay

## 2020-04-26 ENCOUNTER — Other Ambulatory Visit: Payer: BC Managed Care – PPO

## 2020-04-26 DIAGNOSIS — Z20822 Contact with and (suspected) exposure to covid-19: Secondary | ICD-10-CM

## 2020-04-28 LAB — SARS-COV-2, NAA 2 DAY TAT

## 2020-04-28 LAB — NOVEL CORONAVIRUS, NAA: SARS-CoV-2, NAA: NOT DETECTED

## 2020-11-07 ENCOUNTER — Encounter: Payer: BC Managed Care – PPO | Admitting: Family Medicine

## 2020-11-11 ENCOUNTER — Encounter: Payer: BC Managed Care – PPO | Admitting: Family Medicine

## 2020-11-18 ENCOUNTER — Other Ambulatory Visit: Payer: Self-pay

## 2020-11-18 ENCOUNTER — Encounter: Payer: Self-pay | Admitting: Family Medicine

## 2020-11-18 ENCOUNTER — Ambulatory Visit (INDEPENDENT_AMBULATORY_CARE_PROVIDER_SITE_OTHER): Payer: 59 | Admitting: Family Medicine

## 2020-11-18 VITALS — BP 120/86 | HR 66 | Temp 98.4°F | Ht 67.0 in | Wt 158.8 lb

## 2020-11-18 DIAGNOSIS — M25532 Pain in left wrist: Secondary | ICD-10-CM | POA: Diagnosis not present

## 2020-11-18 DIAGNOSIS — E559 Vitamin D deficiency, unspecified: Secondary | ICD-10-CM

## 2020-11-18 DIAGNOSIS — Z0000 Encounter for general adult medical examination without abnormal findings: Secondary | ICD-10-CM | POA: Diagnosis not present

## 2020-11-18 DIAGNOSIS — R1032 Left lower quadrant pain: Secondary | ICD-10-CM

## 2020-11-18 DIAGNOSIS — G5602 Carpal tunnel syndrome, left upper limb: Secondary | ICD-10-CM

## 2020-11-18 DIAGNOSIS — R0683 Snoring: Secondary | ICD-10-CM

## 2020-11-18 DIAGNOSIS — R400 Somnolence: Secondary | ICD-10-CM | POA: Diagnosis not present

## 2020-11-18 DIAGNOSIS — Z91018 Allergy to other foods: Secondary | ICD-10-CM

## 2020-11-18 LAB — LIPID PANEL
Cholesterol: 180 mg/dL (ref 0–200)
HDL: 72.5 mg/dL (ref >39.00)
LDL Cholesterol: 98 mg/dL (ref 0–99)
NonHDL: 107.46
Total CHOL/HDL Ratio: 2
Triglycerides: 49 mg/dL (ref 0.0–149.0)
VLDL: 9.8 mg/dL (ref 0.0–40.0)

## 2020-11-18 LAB — HEMOGLOBIN A1C: Hgb A1c MFr Bld: 5.4 % (ref 4.6–6.5)

## 2020-11-18 LAB — COMPREHENSIVE METABOLIC PANEL
ALT: 11 U/L (ref 0–53)
AST: 17 U/L (ref 0–37)
Albumin: 4.8 g/dL (ref 3.5–5.2)
Alkaline Phosphatase: 65 U/L (ref 39–117)
BUN: 11 mg/dL (ref 6–23)
CO2: 31 mEq/L (ref 19–32)
Calcium: 10.3 mg/dL (ref 8.4–10.5)
Chloride: 100 mEq/L (ref 96–112)
Creatinine, Ser: 1.13 mg/dL (ref 0.40–1.50)
GFR: 88.35 mL/min (ref >60.00)
Glucose, Bld: 85 mg/dL (ref 70–99)
Potassium: 4.5 mEq/L (ref 3.5–5.1)
Sodium: 136 mEq/L (ref 135–145)
Total Bilirubin: 0.8 mg/dL (ref 0.2–1.2)
Total Protein: 7.6 g/dL (ref 6.0–8.3)

## 2020-11-18 LAB — CBC WITH DIFFERENTIAL/PLATELET
Basophils Absolute: 0 10*3/uL (ref 0.0–0.1)
Basophils Relative: 0.7 % (ref 0.0–3.0)
Eosinophils Absolute: 0 10*3/uL (ref 0.0–0.7)
Eosinophils Relative: 0.6 % (ref 0.0–5.0)
HCT: 47.6 % (ref 39.0–52.0)
Hemoglobin: 16 g/dL (ref 13.0–17.0)
Lymphocytes Relative: 37.3 % (ref 12.0–46.0)
Lymphs Abs: 1.5 10*3/uL (ref 0.7–4.0)
MCHC: 33.5 g/dL (ref 30.0–36.0)
MCV: 91.1 fl (ref 78.0–100.0)
Monocytes Absolute: 0.4 10*3/uL (ref 0.1–1.0)
Monocytes Relative: 10.8 % (ref 3.0–12.0)
Neutro Abs: 2 10*3/uL (ref 1.4–7.7)
Neutrophils Relative %: 50.6 % (ref 43.0–77.0)
Platelets: 292 10*3/uL (ref 150.0–400.0)
RBC: 5.23 Mil/uL (ref 4.22–5.81)
RDW: 13.2 % (ref 11.5–15.5)
WBC: 4 10*3/uL (ref 4.0–10.5)

## 2020-11-18 LAB — VITAMIN B12: Vitamin B-12: 401 pg/mL (ref 211–911)

## 2020-11-18 LAB — VITAMIN D 25 HYDROXY (VIT D DEFICIENCY, FRACTURES): VITD: 17.25 ng/mL — ABNORMAL LOW (ref 30.00–100.00)

## 2020-11-18 LAB — TSH: TSH: 0.64 u[IU]/mL (ref 0.35–4.50)

## 2020-11-18 LAB — T4, FREE: Free T4: 0.82 ng/dL (ref 0.60–1.60)

## 2020-11-18 MED ORDER — VITAMIN D (ERGOCALCIFEROL) 1.25 MG (50000 UNIT) PO CAPS: 50000.0000 [IU] | ORAL_CAPSULE | ORAL | 0 refills | Status: DC

## 2020-11-18 NOTE — Progress Notes (Addendum)
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 The patient reports several concerns.  Pt with continued intermittent left wrist pain with radiation into forearm.  Denies current numbness or tingling in hand or fingers.  Patient is left-handed. Is as a Education officer, community.  Noted more after working out.  Patient stopped working out and noticed pain resolved.  Tiger balm helps some. Discomfort does not wake patient up from sleep.  Patient notes waking up feeling unrested.  Goes to bed at 930 p.m. and wakes up at 4:30 AM.  Patient endorses being told he snores and daytime somnolence.  Has fallen asleep at stoplights.  Inquires about desensitization for multiple food allergies.  Patient has allergies to fresh fruits and vegetables, develops itchy throat.  No issues if items are cooked.  Pt mentions h/o stable testicular mass.  Previously followed by u/s.  Social History   Socioeconomic History  . Marital status: Single    Spouse name: Not on file  . Number of children: Not on file  . Years of education: Not on file  . Highest education level: Not on file  Occupational History  . Not on file  Tobacco Use  . Smoking status: Never Smoker  . Smokeless tobacco: Never Used  Substance and Sexual Activity  . Alcohol use: Yes  . Drug use: Never  . Sexual activity: Yes  Other Topics Concern  . Not on file  Social History Narrative  . Not on file   Social Determinants of Health   Financial Resource Strain: Not on file  Food Insecurity: Not on file  Transportation Needs: Not on file  Physical Activity: Not on file  Stress: Not on file  Social Connections: Not on file  Intimate Partner Violence: Not on file   Health Maintenance  Topic Date Due  . Hepatitis C Screening  Never done  . HIV Screening  Never done  . INFLUENZA VACCINE  03/13/2021  . TETANUS/TDAP  04/06/2026  . COVID-19 Vaccine  Completed  . HPV VACCINES  Aged Out    The following  portions of the patient's history were reviewed and updated as appropriate: allergies, current medications, past family history, past medical history, past social history, past surgical history and problem list.  Review of Systems Pertinent items noted in HPI and remainder of comprehensive ROS otherwise negative.   Objective:    BP 120/86 (BP Location: Right Arm, Patient Position: Sitting, Cuff Size: Normal)   Pulse 66   Temp 98.4 F (36.9 C) (Oral)   Ht 5\' 7"  (1.702 m)   Wt 158 lb 12.8 oz (72 kg)   SpO2 98%   BMI 24.87 kg/m  General appearance: alert, cooperative and no distress Head: Normocephalic, without obvious abnormality, atraumatic Eyes: conjunctivae/corneas clear. PERRL, EOM's intact. Fundi benign. Ears: normal TM's and external ear canals both ears Nose: Nares normal. Septum midline. Mucosa normal. No drainage or sinus tenderness. Throat: lips, mucosa, and tongue normal; teeth and gums normal Neck: no adenopathy, no carotid bruit, no JVD, supple, symmetrical, trachea midline and thyroid not enlarged, symmetric, no tenderness/mass/nodules Lungs: clear to auscultation bilaterally Heart: regular rate and rhythm, S1, S2 normal, no murmur, click, rub or gallop Abdomen: soft, non-tender; bowel sounds normal; no masses,  no organomegaly and TTP of LLQ/left groin.  No lymphadenopathy or edema noted. Extremities: extremities normal, atraumatic, no cyanosis or edema and L wrist with mild edema.  R wrist normal in appaearance.  Negative Tinnel's and phalen's.  discomfort with tapping of L lateral epicondyle.  grip strenth 5/5 b/l. Pulses: 2+ and symmetric Skin: Skin color, texture, turgor normal. No rashes or lesions Lymph nodes: Cervical, supraclavicular, and axillary nodes normal. Neurologic: Alert and oriented X 3, normal strength and tone. Normal symmetric reflexes. Normal coordination and gait    Assessment:    Healthy male exam.      Plan:   Anticipatory guidance given  including wearing seatbelts, smoke detectors in the home, increasing physical activity, increasing p.o. intake of water and vegetables. -will obtain labs -given handout -next CPE in 1 yr See After Visit Summary for Counseling Recommendations    Daytime somnolence -Concern for narcolepsy or OSA -Obtain labs and place referral for sleep study - Plan: CBC with Differential/Platelet, TSH, T4, Free, Hemoglobin A1c, Vitamin B12, Vitamin D, 25-hydroxy, PSG Sleep Study  Left wrist pain -Ongoing -Discussed possible causes including carpal tunnel syndrome versus tendinitis -Supportive care including ergonomic workspace, vitamin B6, topical analgesics such as Tiger mom or Biofreeze, splinting at night -Consider prednisone taper for continued or worsened symptoms -We will start PT - Plan: CBC with Differential/Platelet, Ambulatory referral to Physical Therapy, CMP  LLQ pain -Discussed possible causes including inguinal hernia.  Also consider h/o testicular mass. -Discussed other possible causes for less likely including constipation, muscle strain -Given strict precautions for symptoms indicating incarcerated or strangulated hernia -We will obtain ultrasound to evaluate -Based on ultrasound will place referral to GEN surg if inguinal hernia noted - Plan: CBC with Differential/Platelet, US Scrotum, CMP  Snoring -Concern for narcolepsy versus OSA -Stop bang score 3, intermediate risk -Plan: PSG sleep study  Carpal tunnel syndrome of left wrist  - Plan: CBC with Differential/Platelet, Vitamin B12, Vitamin D, 25-hydroxy, Ambulatory referral to Physical Therapy  Food allergy  -Allergies to raw fruits (strawberries, apples, banana) and vegetables.  Able to eat the foods when cooked without issue -Given precautions.  Consider EpiPen -Interested in desensitization.  Will place referral to allergy - Plan: Ambulatory referral to Allergy  F/u prn in the next few wks for worsened or continued  symptoms  Update: Vitamin D low at 17.25.  Ergocalciferol 50,000 IU sent to pharmacy.  1 pill weekly x 12 weeks.  Abbe Amsterdam, MD

## 2020-11-18 NOTE — Patient Instructions (Signed)
Preventive Care 29-29 Years Old, Male Preventive care refers to lifestyle choices and visits with your health care provider that can promote health and wellness. This includes:  A yearly physical exam. This is also called an annual wellness visit.  Regular dental and eye exams.  Immunizations.  Screening for certain conditions.  Healthy lifestyle choices, such as: ? Eating a healthy diet. ? Getting regular exercise. ? Not using drugs or products that contain nicotine and tobacco. ? Limiting alcohol use. What can I expect for my preventive care visit? Physical exam Your health care provider may check your:  Height and weight. These may be used to calculate your BMI (body mass index). BMI is a measurement that tells if you are at a healthy weight.  Heart rate and blood pressure.  Body temperature.  Skin for abnormal spots. Counseling Your health care provider may ask you questions about your:  Past medical problems.  Family's medical history.  Alcohol, tobacco, and drug use.  Emotional well-being.  Home life and relationship well-being.  Sexual activity.  Diet, exercise, and sleep habits.  Work and work Statistician.  Access to firearms. What immunizations do I need? Vaccines are usually given at various ages, according to a schedule. Your health care provider will recommend vaccines for you based on your age, medical history, and lifestyle or other factors, such as travel or where you work.   What tests do I need? Blood tests  Lipid and cholesterol levels. These may be checked every 5 years starting at age 29.  Hepatitis C test.  Hepatitis B test. Screening  Diabetes screening. This is done by checking your blood sugar (glucose) after you have not eaten for a while (fasting).  Genital exam to check for testicular cancer or hernias.  STD (sexually transmitted disease) testing, if you are at risk. Talk with your health care provider about your test results,  treatment options, and if necessary, the need for more tests.   Follow these instructions at home: Eating and drinking  Eat a healthy diet that includes fresh fruits and vegetables, whole grains, lean protein, and low-fat dairy products.  Drink enough fluid to keep your urine pale yellow.  Take vitamin and mineral supplements as recommended by your health care provider.  Do not drink alcohol if your health care provider tells you not to drink.  If you drink alcohol: ? Limit how much you have to 0-2 drinks a day. ? Be aware of how much alcohol is in your drink. In the [place] one drink equals one 12 oz bottle of beer (355 mL), one 5 oz glass of wine (148 mL), or one 1 oz glass of hard liquor (44 mL).   Lifestyle  Take daily care of your teeth and gums. Brush your teeth every morning and night with fluoride toothpaste. Floss one time each day.  Stay active. Exercise for at least 30 minutes 5 or more days each week.  Do not use any products that contain nicotine or tobacco, such as cigarettes, e-cigarettes, and chewing tobacco. If you need help quitting, ask your health care provider.  Do not use drugs.  If you are sexually active, practice safe sex. Use a condom or other form of protection to prevent STIs (sexually transmitted infections).  Find healthy ways to cope with stress, such as: ? Meditation, yoga, or listening to music. ? Journaling. ? Talking to a trusted person. ? Spending time with friends and family. Safety  Always wear your seat belt while driving  or riding in a vehicle.  Do not drive: ? If you have been drinking alcohol. Do not ride with someone who has been drinking. ? When you are tired or distracted. ? While texting.  Wear a helmet and other protective equipment during sports activities.  If you have firearms in your house, make sure you follow all gun safety procedures.  Seek help if you have been physically or sexually abused. What's next?  Go to your  health care provider once a year for an annual wellness visit.  Ask your health care provider how often you should have your eyes and teeth checked.  Stay up to date on all vaccines. This information is not intended to replace advice given to you by your health care provider. Make sure you discuss any questions you have with your health care provider. Document Revised: 04/15/2019 Document Reviewed: 07/24/2018 Elsevier Patient Education  2021 [place].  Wrist Pain, Adult There are many things that can cause wrist pain. Some common causes include:  An injury to the wrist area, such as a sprain, strain, or fracture.  Overuse of the joint.  A condition that causes increased pressure on a nerve in the wrist (carpal tunnel syndrome).  Wear and tear of the joints that occurs with aging (osteoarthritis).  Other types of joint inflammation and stiffness (arthritis). Sometimes, the cause of wrist pain is not known. Often, the pain goes away when you follow instructions from your health care provider for relieving pain at home, such as resting the wrist, icing the wrist, or using a splint or an elastic wrap for a short time. If your wrist pain continues, it is important to tell your health care provider. Follow these instructions at home: If you have a splint or elastic wrap:  Wear the splint or wrap as told by your health care provider. Remove it only as told by your health care provider. Ask your health care provider if you may remove it for bathing.  Loosen the splint or wrap if your fingers tingle, become numb, or turn cold and blue.  Check the skin around the splint or wrap every day. Tell your health care provider about any concerns.  Keep the splint or wrap clean.  If the splint or wrap is not waterproof: ? Do not let it get wet. ? Cover it with a watertight covering when you take a bath or shower. Managing pain, stiffness, and swelling  If directed, put ice on the painful  area. To do this: ? If you have a removable splint or wrap, remove it as told by your health care provider. ? Put ice in a plastic bag. ? Place a towel between your skin and the bag or between your splint or wrap and the bag. ? Leave the ice on for 20 minutes, 2-3 times a day.  Move your fingers often to reduce stiffness and swelling.  Raise (elevate) the injured area above the level of your heart while you are sitting or lying down.   Activity  Rest your affected wrist as told by your health care provider.  Return to your normal activities as told by your health care provider. Ask your health care provider what activities are safe for you.  Ask your health care provider when it is safe to drive if you have a splint or wrap on your wrist.  Do exercises as told by your health care provider. General instructions  Pay attention to any changes in your symptoms.  Take  over-the-counter and prescription medicines only as told by your health care provider.  Keep all follow-up visits as told by your health care provider. This is important. Contact a health care provider if:  You have a sudden, sharp pain in the wrist, hand, or arm that is different or new.  The swelling or bruising on your wrist or hand gets worse.  Your skin becomes red, gets a rash, or has open sores.  Your pain does not get better or it gets worse.  You have a fever or chills. Get help right away if:  You lose feeling in your fingers or hand.  Your fingers turn white, very red, or cold and blue.  You cannot move your fingers. Summary  Wrist pain in an adult has many different causes.  If your wrist pain continues, it is important to tell your health care provider.  You may need to wear a splint or an elastic wrap for a short period of time.  Return to your normal activities as told by your health care provider. Ask your health care provider what activities are safe for you. This information is not  intended to replace advice given to you by your health care provider. Make sure you discuss any questions you have with your health care provider. Document Revised: 06/18/2019 Document Reviewed: 06/18/2019 Elsevier Patient Education  2021 [employer].  Food Allergy A food allergy is an abnormal reaction to a food (food allergen) by the body's defense system (immune system). Foods that commonly cause allergies include:  Milk.  Seafood.  Eggs.  Peanuts.  Wheat.  Soy.  Tree nuts such as pecans, walnuts, and cashews. What are the causes? Food allergies happen when the immune system sees a food as harmful and releases chemicals (antibodies) to fight it. What are the signs or symptoms? Symptoms may be mild or severe. They usually start minutes after eating the food, but they can occur even a few hours later. In people with a severe allergy, symptoms can start within seconds. Mild symptoms of this condition include:  Congested nose.  Tingling in the mouth.  An itchy, red rash.  Vomiting.  Diarrhea. In people with a severe allergy, a life-threatening reaction can occur called anaphylaxis. Get help right away if you have symptoms of anaphylaxis, such as:  Feeling warm in the face (flushed). This may include redness.  Itchy, red, swollen areas of skin (hives).  Swelling of the eyes, lips, face, mouth, tongue, or throat.  Difficulty breathing, speaking, or swallowing.  Noisy breathing (wheezing).  Dizziness or light-headedness.  Fainting.  Pain or cramping in the abdomen. How is this diagnosed? This condition may be diagnosed based on:  A physical exam.  Your medical history.  Skin tests.  Blood tests.  A food challenge test. This test involves eating the food that may be causing the allergic response while being monitored for a reaction by your health care provider.  The results of an elimination diet. The elimination diet involves removing foods from your  diet and then adding them back in, one at a time.  A food diary. How is this treated? There is no cure for food allergies. Treatment focuses on preventing exposure to the food or foods you are allergic to and treating reactions if you are exposed to the food. Mild symptoms may not need treatment.  Severe reactions usually need to be treated at a hospital. Treatment may include:  Medicines that help: ? Tighten your blood vessels (epinephrine). ? Relieve  itching and hives (antihistamines). ? Widen the narrow and tight airways (bronchodilators). ? Reduce swelling (corticosteroids).  Oxygen therapy to help you breathe.  IV fluids to keep you hydrated. After a severe reaction, you may be given rescue medicines, such as:  An anaphylaxis kit.  An epinephrine injection, commonly called an auto-injector "pen" (pre-filled automatic epinephrine injection device). Your health care provider may teach you how to use these if you are accidentally exposed to an allergen. Follow these instructions at home: If you have a potential allergy:  Follow the elimination diet as told by your health care provider.  Keep a food diary as told by your health care provider. Every day, write down: ? What you eat and drink and when. ? What symptoms you have and when. If you have a severe allergy:  Wear a medical alert bracelet or necklace that describes your allergy.  Carry your anaphylaxis kit or an auto-injector pen with you at all times. Use them as told by your health care provider.  Make sure that you, your family members, and your employer know: ? The signs of anaphylaxis. ? How to use an anaphylaxis kit. ? How to use an auto-injector pen.  If you think that you are having an anaphylactic reaction, use your auto-injector pen or anaphylaxis kit.  Replace your auto-injector pen immediately after use, in case you have another reaction.  Get medical care after use your auto-injector pen. This is  important because you can have a delayed, life-threatening reaction after taking the medicine (rebound anaphylaxis).   General instructions  Avoid the foods that you are allergic to.  Read food labels before you eat packaged items. Look for ingredients that you are allergic to.  When you are at a restaurant, tell your server that you have an allergy. If you are unsure whether a meal has an ingredient that you are allergic to, ask your server.  Take over-the-counter and prescription medicines only as told by your health care provider. ? Do not drive until the medicine has worn off, unless your health care provider gives you approval.  Inform all health care providers that you have a food allergy.  If you think that you might be allergic to something else, talk with your health care provider. Do not eat a food to see if you are allergic to it without talking with your health care provider first. Contact a health care provider if you:  Have symptoms that do not go away within 2 days.  Have symptoms that get worse.  Have new symptoms. Get help right away if you have symptoms of anaphylaxis:  Flushed skin.  Hives.  Swelling of the eyes, lips, face, mouth, tongue, or throat.  Difficulty breathing, speaking, or swallowing.  Wheezing.  Dizziness or light-headedness.  Fainting.  Pain or cramping in the abdomen. These symptoms may represent a serious problem that is an emergency. Do not wait to see if the symptoms will go away. Use your auto-injector pen or anaphylaxis kit as you have been told. Get medical help right away. Call your local emergency services (911 in the [place] Do not drive yourself to the hospital. If you needed to use an auto-injector pen, you need more medical care even if the medicine seems to be helping. This is important because anaphylaxis may happen again within 72 hours. Summary  A food allergy is an abnormal reaction to a food (food allergen) by the body's  defense system (immune system).  There is no cure for  food allergies. Treatment focuses on preventing exposure to the food or foods you are allergic to and treating reactions if you are exposed to the food.  Wear a medical alert bracelet or necklace that describes your allergy.  If you have symptoms of anaphylaxis, use your auto-injector pen or anaphylaxis kit as you have been instructed, and get medical help right away. This information is not intended to replace advice given to you by your health care provider. Make sure you discuss any questions you have with your health care provider. Document Revised: 05/10/2020 Document Reviewed: 05/10/2020 Elsevier Patient Education  2021 [place].  Carpal Tunnel Syndrome  Carpal tunnel syndrome is a condition that causes pain, numbness, and weakness in your hand and fingers. The carpal tunnel is a narrow area located on the palm side of your wrist. Repeated wrist motion or certain diseases may cause swelling within the tunnel. This swelling pinches the main nerve in the wrist. The main nerve in the wrist is called the median nerve. What are the causes? This condition may be caused by:  Repeated and forceful wrist and hand motions.  Wrist injuries.  Arthritis.  A cyst or tumor in the carpal tunnel.  Fluid buildup during pregnancy.  Use of tools that vibrate. Sometimes the cause of this condition is not known. What increases the risk? The following factors may make you more likely to develop this condition:  Having a job that requires you to repeatedly or forcefully move your wrist or hand or requires you to use tools that vibrate. This may include jobs that involve using computers, working on an [employer], or working with [place] such as Pension scheme manager.  Being a woman.  Having certain conditions, such as: ? Diabetes. ? Obesity. ? An underactive thyroid (hypothyroidism). ? Kidney failure. ? Rheumatoid arthritis. What  are the signs or symptoms? Symptoms of this condition include:  A tingling feeling in your fingers, especially in your thumb, index, and middle fingers.  Tingling or numbness in your hand.  An aching feeling in your entire arm, especially when your wrist and elbow are bent for a long time.  Wrist pain that goes up your arm to your shoulder.  Pain that goes down into your palm or fingers.  A weak feeling in your hands. You may have trouble grabbing and holding items. Your symptoms may feel worse during the night. How is this diagnosed? This condition is diagnosed with a medical history and physical exam. You may also have tests, including:  Electromyogram (EMG). This test measures electrical signals sent by your nerves into the muscles.  Nerve conduction study. This test measures how well electrical signals pass through your nerves.  Imaging tests, such as X-rays, ultrasound, and MRI. These tests check for possible causes of your condition. How is this treated? This condition may be treated with:  Lifestyle changes. It is important to stop or change the activity that caused your condition.  Doing exercise and activities to strengthen and stretch your muscles and tendons (physical therapy).  Making lifestyle changes to help with your condition and learning how to do your daily activities safely (occupational therapy).  Medicines for pain and inflammation. This may include medicine that is injected into your wrist.  A wrist splint or brace.  Surgery. Follow these instructions at home: If you have a splint or brace:  Wear the splint or brace as told by your health care provider. Remove it only as told by your health care  provider.  Loosen the splint or brace if your fingers tingle, become numb, or turn cold and blue.  Keep the splint or brace clean.  If the splint or brace is not waterproof: ? Do not let it get wet. ? Cover it with a watertight covering when you take a  bath or shower. Managing pain, stiffness, and swelling If directed, put ice on the painful area. To do this:  If you have a removeable splint or brace, remove it as told by your health care provider.  Put ice in a plastic bag.  Place a towel between your skin and the bag or between the splint or brace and the bag.  Leave the ice on for 20 minutes, 2-3 times a day. Do not fall asleep with the cold pack on your skin.  Remove the ice if your skin turns bright red. This is very important. If you cannot feel pain, heat, or cold, you have a greater risk of damage to the area. Move your fingers often to reduce stiffness and swelling.   General instructions  Take over-the-counter and prescription medicines only as told by your health care provider.  Rest your wrist and hand from any activity that may be causing your pain. If your condition is work related, talk with your employer about changes that can be made, such as getting a wrist pad to use while typing.  Do any exercises as told by your health care provider, physical therapist, or occupational therapist.  Keep all follow-up visits. This is important. Contact a health care provider if:  You have new symptoms.  Your pain is not controlled with medicines.  Your symptoms get worse. Get help right away if:  You have severe numbness or tingling in your wrist or hand. Summary  Carpal tunnel syndrome is a condition that causes pain, numbness, and weakness in your hand and fingers.  It is usually caused by repeated wrist motions.  Lifestyle changes and medicines are used to treat carpal tunnel syndrome. Surgery may be recommended.  Follow your health care provider's instructions about wearing a splint, resting from activity, keeping follow-up visits, and calling for help. This information is not intended to replace advice given to you by your health care provider. Make sure you discuss any questions you have with your health care  provider. Document Revised: 12/10/2019 Document Reviewed: 12/10/2019 Elsevier Patient Education  2021 [place].  Inguinal Hernia, Adult An inguinal hernia develops when fat or the intestines push through a weak spot in a muscle where the leg meets the lower abdomen (groin). This creates a bulge. This kind of hernia could also be:  In the scrotum, if you are male.  In folds of skin around the vagina, if you are male. There are three types of inguinal hernias:  Hernias that can be pushed back into the abdomen (are reducible). This type rarely causes pain.  Hernias that are not reducible (are incarcerated).  Hernias that are not reducible and lose their blood supply (are strangulated). This type of hernia requires emergency surgery. What are the causes? This condition is caused by having a weak spot in the muscles or tissues in your groin. This develops over time. The hernia may poke through the weak spot when you suddenly strain your lower abdominal muscles, such as when you:  Lift a heavy object.  Strain to have a bowel movement. Constipation can lead to straining.  Cough. What increases the risk? This condition is more likely to develop in:  Males.  Pregnant females.  People who: ? Are overweight. ? Work in jobs that require long periods of standing or heavy lifting. ? Have had an inguinal hernia before. ? Smoke or have lung disease. These factors can lead to long-term (chronic) coughing. What are the signs or symptoms? Symptoms may depend on the size of the hernia. Often, a small inguinal hernia has no symptoms. Symptoms of a larger hernia may include:  A bulge in the groin area. This is easier to see when standing. It might not be visible when lying down.  Pain or burning in the groin. This may get worse when lifting, straining, or coughing.  A dull ache or a feeling of pressure in the groin.  An unusual bulge in the scrotum, in males. Symptoms of a  strangulated inguinal hernia may include:  A bulge in your groin that is very painful and tender to the touch.  A bulge that turns red or purple.  Fever, nausea, and vomiting.  Inability to have a bowel movement or to pass gas. How is this diagnosed? This condition is diagnosed based on your symptoms, your medical history, and a physical exam. Your health care provider may feel your groin area and ask you to cough. How is this treated? Treatment depends on the size of your hernia and whether you have symptoms. If you do not have symptoms, your health care provider may have you watch your hernia carefully and have you come in for follow-up visits. If your hernia is large or if you have symptoms, you may need surgery to repair the hernia. Follow these instructions at home: Lifestyle  Avoid lifting heavy objects.  Avoid standing for long periods of time.  Do not use any products that contain nicotine or tobacco. These products include cigarettes, chewing tobacco, and vaping devices, such as e-cigarettes. If you need help quitting, ask your health care provider.  Maintain a healthy weight. Preventing constipation You may need to take these actions to prevent or treat constipation:  Drink enough fluid to keep your urine pale yellow.  Take over-the-counter or prescription medicines.  Eat foods that are high in fiber, such as beans, whole grains, and fresh fruits and vegetables.  Limit foods that are high in fat and processed sugars, such as fried or sweet foods. General instructions  You may try to push the hernia back in place by very gently pressing on it while lying down. Do not try to force the bulge back in if it will not push in easily.  Watch your hernia for any changes in shape, size, or color. Get help right away if you notice any changes.  Take over-the-counter and prescription medicines only as told by your health care provider.  Keep all follow-up visits. This is  important. Contact a health care provider if:  You have a fever or chills.  You develop new symptoms.  Your symptoms get worse. Get help right away if:  You have pain in your groin that suddenly gets worse.  You have a bulge in your groin that: ? Suddenly gets bigger and does not get smaller. ? Becomes red or purple or painful to the touch.  You are a man and you have a sudden pain in your scrotum, or the size of your scrotum suddenly changes.  You cannot push the hernia back in place by very gently pressing on it when you are lying down.  You have nausea or vomiting that does not go away.  You  have a fast heartbeat.  You cannot have a bowel movement or pass gas. These symptoms may represent a serious problem that is an emergency. Do not wait to see if the symptoms will go away. Get medical help right away. Call your local emergency services (911 in the [place] Summary  An inguinal hernia develops when fat or the intestines push through a weak spot in a muscle where your leg meets your lower abdomen (groin).  This condition is caused by having a weak spot in muscles or tissues in your groin.  Symptoms may depend on the size of the hernia, and they may include pain or swelling in your groin. A small inguinal hernia often has no symptoms.  Treatment may not be needed if you do not have symptoms. If you have symptoms or a large hernia, you may need surgery to repair the hernia.  Avoid lifting heavy objects. Also, avoid standing for long periods of time. This information is not intended to replace advice given to you by your health care provider. Make sure you discuss any questions you have with your health care provider. Document Revised: 03/29/2020 Document Reviewed: 03/29/2020 Elsevier Patient Education  2021 [employer].

## 2020-11-18 NOTE — Progress Notes (Signed)
Results viewed on MyChart. 

## 2020-11-21 ENCOUNTER — Telehealth: Payer: Self-pay | Admitting: Family Medicine

## 2020-11-21 NOTE — Telephone Encounter (Signed)
Charles Harris from [facility] is calling and requesting demographics and insurance information to be faxed to [phone]. CB is [phone]

## 2020-11-21 NOTE — Telephone Encounter (Signed)
Information faxed 11/21/20.

## 2020-11-23 ENCOUNTER — Other Ambulatory Visit: Payer: 59

## 2020-12-09 ENCOUNTER — Inpatient Hospital Stay: Admission: RE | Admit: 2020-12-09 | Payer: 59 | Source: Ambulatory Visit

## 2020-12-09 ENCOUNTER — Ambulatory Visit
Admission: RE | Admit: 2020-12-09 | Discharge: 2020-12-09 | Disposition: A | Discharge status: Home / Self Care | Payer: 59 | Source: Ambulatory Visit | Attending: Family Medicine | Admitting: Family Medicine

## 2020-12-09 ENCOUNTER — Other Ambulatory Visit: Payer: 59

## 2020-12-09 ENCOUNTER — Other Ambulatory Visit: Payer: Self-pay | Admitting: Family Medicine

## 2020-12-09 DIAGNOSIS — R1032 Left lower quadrant pain: Secondary | ICD-10-CM

## 2020-12-12 NOTE — Progress Notes (Signed)
Patient viewed results on MyChart. 

## 2021-01-06 ENCOUNTER — Other Ambulatory Visit: Payer: Self-pay

## 2021-01-06 ENCOUNTER — Encounter: Payer: Self-pay | Admitting: Family Medicine

## 2021-01-06 ENCOUNTER — Ambulatory Visit: Payer: 59 | Admitting: Family Medicine

## 2021-01-06 VITALS — BP 124/82 | HR 82 | Temp 98.2°F | Wt 159.2 lb

## 2021-01-06 DIAGNOSIS — M25532 Pain in left wrist: Secondary | ICD-10-CM | POA: Diagnosis not present

## 2021-01-06 DIAGNOSIS — G629 Polyneuropathy, unspecified: Secondary | ICD-10-CM | POA: Diagnosis not present

## 2021-01-06 DIAGNOSIS — R400 Somnolence: Secondary | ICD-10-CM | POA: Diagnosis not present

## 2021-01-06 DIAGNOSIS — G8929 Other chronic pain: Secondary | ICD-10-CM

## 2021-01-06 NOTE — Progress Notes (Signed)
Subjective:    Patient ID: Jerami Tammen, male    DOB: 1991-02-25, 30 y.o.   MRN: [id number]  Chief Complaint  Patient presents with  . Follow-up    Continued wrist pain despite PT.  Neuropathy.  Drowsiness.    HPI Patient was seen today for follow-up chronic conditions.  Pt in PT for left wrist pain/concern for carpal tunnel.  Patient notes continued pain, numbness/tingling during PT and while driving.  Had dry needling and other therapies during PT which have not been successful.  Per PT note consider imaging and occupational therapy for continued symptoms.  Patient also notes numbness/tingling in the left medial elbow without radiation.  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  Patient is yet to hear anything about sleep study.  Still having difficulty getting restful sleep.  Patient also notes continued daytime somnolence.  At times falling asleep while driving.  Patient has not noticed a difference in fatigue since taking ergocalciferol 50,000 IU weekly for vitamin D deficiency.  Past Medical History:  Diagnosis Date  . Asthma     Allergies  Allergen Reactions  . Fruit & Vegetable Daily [Nutritional Supplements]     SOME FRUITS, STRAWBERRY,BANANAS, APPLES   Family History  Problem Relation Age of Onset  . Diabetes Father   . Hypertension Father   . Arthritis Maternal Grandmother   . Hypertension Maternal Grandfather     ROS General: Denies fever, chills, night sweats, changes in weight, changes in appetite + daytime somnolence HEENT: Denies headaches, ear pain, changes in vision, rhinorrhea, sore throat CV: Denies CP, palpitations, SOB, orthopnea Pulm: Denies SOB, cough, wheezing GI: Denies abdominal pain, nausea, vomiting, diarrhea, constipation GU: Denies dysuria, hematuria, frequency, vaginal discharge Msk: Denies muscle cramps, joint pains+ left wrist pain Neuro: Denies weakness + numbness, tingling in left elbow and left wrist. Skin:  Denies rashes, bruising Psych: Denies depression, anxiety, hallucinations      Objective:    Blood pressure 124/82, pulse 82, temperature 98.2 F (36.8 C), temperature source Oral, weight 159 lb 3.2 oz (72.2 kg), SpO2 98 %.  Gen. Pleasant, well-nourished, in no distress, normal affect  HEENT: [place]/AT, face symmetric, conjunctiva clear, no scleral icterus, PERRLA, EOMI, nares patent without drainage, pharynx without erythema or exudate. Neck: No JVD, no thyromegaly, no carotid bruits Lungs: no accessory muscle use, CTAB, no wheezes or rales Cardiovascular: RRR, no m/r/g, no peripheral edema Abdomen: BS present, soft, NT/ND, no hepatosplenomegaly. Musculoskeletal: No deformities, no cyanosis or clubbing, normal tone Neuro:  A&Ox3, CN II-XII intact, normal gait Skin:  Warm, no lesions/ rash   Wt Readings from Last 3 Encounters:  01/06/21 159 lb 3.2 oz (72.2 kg)  11/18/20 158 lb 12.8 oz (72 kg)  01/28/20 152 lb (68.9 kg)    Lab Results  Component Value Date   WBC 4.0 11/18/2020   HGB 16.0 11/18/2020   HCT 47.6 11/18/2020   PLT 292.0 11/18/2020   GLUCOSE 85 11/18/2020   CHOL 180 11/18/2020   TRIG 49.0 11/18/2020   HDL 72.50 11/18/2020   LDLCALC 98 11/18/2020   ALT 11 11/18/2020   AST 17 11/18/2020   NA 136 11/18/2020   K 4.5 11/18/2020   CL 100 11/18/2020   CREATININE 1.13 11/18/2020   BUN 11 11/18/2020   CO2 31 11/18/2020   TSH 0.64 11/18/2020   HGBA1C 5.4 11/18/2020    Assessment/Plan:  Daytime somnolence -Continued symptoms/worsening -No improvement in symptoms with correction of vitamin D  deficiency.  Vitamin D 17.25 on 11/18/2020 -Discussed treatment of symptoms with stimulant medications.  Patient declines. - Plan: Ambulatory referral to Sleep Studies  Chronic pain of left wrist -Continued/worsening pain despite PT -We will place order for MRI left wrist to evaluate for possible muscle tear -Also discussed EMG/NCS.  Referral for neurology -Continue treatment  symptoms including ice, heat, rest, compression, Tylenol/NSAIDs, topical analgesics - Plan: Ambulatory referral to Neurology, MR WRIST LEFT WO CONTRAST  Neuropathy -Vitamin B12 401 on 11/18/2020 - Plan: Ambulatory referral to Neurology, MR WRIST LEFT WO CONTRAST  F/u as needed  Abbe Amsterdam, MD

## 2021-01-12 NOTE — Progress Notes (Signed)
New Patient Note  RE: Charles Harris MRN: [id number] DOB: 12-15-1990 Date of Office Visit: 01/13/2021  Consult requested by: Deeann Saint, MD Primary care provider: Deeann Saint, MD  Chief Complaint: Allergic Rhinitis   History of Present Illness: I had the pleasure of seeing Charles Harris for initial evaluation at the [facility] on 01/13/2021. He is a 30 y.o. male, who is referred here by Deeann Saint, MD for the evaluation of food allergies.  Food: 10 years ago patient had a reaction to mango.  He noted sore throat, itchy throat, breathing issues, facial pruritus.  He usually develops perioral pruritus, itchy/sore throat after eating fresh fruits.  Sometimes he has a "cooling" sensation as well.   Peanuts, tree nuts tend to cause similar reactions as above.  Sometimes has diarrhea after eating smoothies.   Symptoms start within 5-10 minutes. The symptoms usually last for less than 30 minutes. He was not evaluated in ED. Since this episode, he does report eating fresh fruits occasionally. He does not have access to epinephrine autoinjector.  Past work up includes: skin prick testing was positive to fruits and vegetables over 10 years ago. Dietary History: patient has been eating other foods including milk, eggs, sesame, shellfish, fish, soy, wheat, meats, cooked/processed fruits and vegetables.  11/18/2020 PCP visit: "Inquires about desensitization for multiple food allergies.  Patient has allergies to fresh fruits and vegetables, develops itchy throat.  No issues if items are cooked."  Assessment and Plan: Jermond is a 30 y.o. male with: Other adverse food reactions, not elsewhere classified, subsequent encounter Patient noted perioral pruritus and facial pruritus after eating fresh fruits, mangoes, peanuts, tree nuts. Tolerates processed forms.  Symptoms resolved within 30 minutes without any medications.   Today's skin testing showed: Positive to  almond, hazelnut. Borderline to peanut, bananas, peach. Negative to other select foods.   Continue to avoid foods that are bothersome.  Discussed that his food triggered oral and throat symptoms are likely caused by oral food allergy syndrome (OFAS). This is caused by cross reactivity of pollen with fresh fruits and vegetables, and nuts. Symptoms are usually localized in the form of itching and burning in mouth and throat. Very rarely it can progress to more severe symptoms. Eating foods in cooked or processed forms usually minimizes symptoms. I recommended avoidance of eating the problem foods, especially during the peak season(s). Sometimes, OFAS can induce severe throat swelling or even a systemic reaction; with such instance, I advised them to report to a local ER. A list of common pollens and food cross-reactivities was provided to the patient.   Offered bloodwork for mango as there is no skin prick testing for that - patient declined.   Other allergic rhinitis Rhinitis symptoms mainly in the spring for many years.  Using Zyrtec and Allegra with good benefit.  Skin testing in the past showed positive to trees per patient report.  No prior allergy immunotherapy. 1 dog at home.  Today's skin testing showed: Positive to grass, trees, ragweed, weed, dust mites, cat. Borderline to dog.  Start environmental control measures as below.  Use over the counter antihistamines such as Zyrtec (cetirizine), Claritin (loratadine), Allegra (fexofenadine), or Xyzal (levocetirizine) daily as needed. May take twice a day during allergy flares. May switch antihistamines every few months.  Consider allergy injections for long term control if above medications do not help the symptoms - handout given.   Discussed with patient that sometimes allergy immunotherapy  helps the oral allergy syndrome as well.   Let us know when ready to start injections.   Mild intermittent asthma without complication Some wheezing  during the spring and uses albuterol prn with good benefit. One asthma flare in [place] requiring ER visit a few years ago.   Today's spirometry was normal. . May use albuterol rescue inhaler 2 puffs every 4 to 6 hours as needed for shortness of breath, chest tightness, coughing, and wheezing. Monitor frequency of use.   Return in about 6 months (around 07/15/2021).  Meds ordered this encounter  Medications  . albuterol (VENTOLIN HFA) 108 (90 Base) MCG/ACT inhaler    Sig: Inhale 2 puffs into the lungs every 4 (four) hours as needed for wheezing or shortness of breath (coughing fits).    Dispense:  8 g    Refill:  2   Lab Orders  No laboratory test(s) ordered today    Other allergy screening: Asthma: yes  Some wheezing during the spring months and uses albuterol prn with good benefit.  One episode of asthma attack requiring ER visit when living in [place].  Rhino conjunctivitis: yes  He reports symptoms of sneezing, rhinorrhea, rhinorrhea. Symptoms have been going on for many years. The symptoms are present mainly in the spring. He has used zyrtec, and allegra with fair improvement in symptoms. Previous work up includes: skin testing in the past was positive to trees in the past per patient report. No prior allergy injections.   Allergy symptoms have improved [place].  Medication allergy: no Hymenoptera allergy: no Urticaria: no Eczema:no History of recurrent infections suggestive of immunodeficency: no  Diagnostics: Spirometry:  Tracings reviewed. His effort: Good reproducible efforts. FVC: 3.31L FEV1: 2.72L, 77% predicted FEV1/FVC ratio: 82% Interpretation: Spirometry consistent with normal pattern.  Please see scanned spirometry results for details.  Skin Testing: Environmental allergy panel and select foods. Positive to grass, trees, ragweed, weed, dust mites, cat. Borderline to dog. Positive to almond, hazelnut. Borderline to peanut, bananas, peach.   Results discussed with patient/family.  Airborne Adult Perc - 01/13/21 0934    Time Antigen Placed [id number]    Allergen Manufacturer Waynette ButteryGreer    Location Back    Number of Test 59    Panel 1 Select    1. Control-Buffer 50% Glycerol Negative    2. Control-Histamine 1 mg/ml 2+    3. Albumin saline Negative    4. [place] 4+    5. [place] 4+    6. Johnson 3+    7. Kentucky Blue 4+    8. Meadow Fescue 3+    9. Perennial Rye 4+    10. Sweet Vernal 4+    11. Timothy 4+    12. Cocklebur Negative    13. Burweed Marshelder Negative    14. Ragweed, short Negative    15. Ragweed, Giant Negative    16. Plantain,  English Negative    17. Lamb's Quarters Negative    18. Sheep Sorrell Negative    19. Rough Pigweed Negative    20. Marsh Elder, Rough Negative    21. Mugwort, Common Negative    22. Ash mix Negative    23. Birch mix 4+    24. Beech American 4+    25. Box, Elder 2+    26. Cedar, red Negative    27. Cottonwood, [place]astern Negative    28. Elm mix Negative    29. Hickory Negative    30. Maple mix Negative  31. Oak, [place] mix 4+    32. Pecan Pollen Negative    33. Pine mix Negative    34. Sycamore [place] --   +/-   35. Walnut, Black Pollen Negative    36. Alternaria alternata Negative    37. Cladosporium Herbarum Negative    38. Aspergillus mix Negative    39. Penicillium mix Negative    40. Bipolaris sorokiniana (Helminthosporium) Negative    41. Drechslera spicifera (Curvularia) Negative    42. Mucor plumbeus Negative    43. Fusarium moniliforme Negative    44. Aureobasidium pullulans (pullulara) Negative    45. Rhizopus oryzae Negative    46. Botrytis cinera Negative    47. Epicoccum nigrum Negative    48. Phoma betae Negative    49. Candida Albicans Negative    50. Trichophyton mentagrophytes Negative    51. Mite, D Farinae  5,000 AU/ml 2+    52. Mite, D Pteronyssinus  5,000 AU/ml 2+    53. Cat Hair 10,000 BAU/ml Negative    54.  Dog Epithelia Negative    55. Mixed  Feathers Negative    56. Horse Epithelia Negative    57. Cockroach, German Negative    58. Mouse Negative    59. Tobacco Leaf Negative          Intradermal - 01/13/21 0954    Time Antigen Placed 4540    Allergen Manufacturer Waynette Buttery    Location Arm    Number of Test 10    Intradermal Select    Control Negative    Ragweed mix 2+    Weed mix 2+    Mold 1 Negative    Mold 2 Negative    Mold 3 Negative    Mold 4 Negative    Cat 2+    Dog --   +/-   Cockroach Negative          Food Adult Perc - 01/13/21 0900    Time Antigen Placed 9811    Allergen Manufacturer Waynette Buttery    Location Back    Number of allergen test 20    1. Peanut --   +/-   10. Cashew Negative    11. Pecan Food Negative    12. Walnut Food Negative    13. Almond --   3x4   14. Hazelnut --   7x4   15. [place] nut Negative    16. Coconut Negative    17. Pistachio Negative    30. Barley Negative    31. Oat  Negative    52. Celery Negative    53. Corn Negative    55. Grape (White seedless) Negative    56. Orange  Negative    57. Banana --   +/-   58. Apple Negative    59. Peach --   +/-   60. Strawberry Negative    63. Pineapple Negative           Past Medical History: Patient Active Problem List   Diagnosis Date Noted  . Oral allergy syndrome, subsequent encounter 01/13/2021  . Other adverse food reactions, not elsewhere classified, subsequent encounter 01/13/2021  . Other allergic rhinitis 01/13/2021  . H/O concussion 11/06/2019  . Seasonal allergies 06/13/2018  . Mild intermittent asthma without complication 06/13/2018   Past Medical History:  Diagnosis Date  . Asthma   . Urticaria    Past Surgical History: History reviewed. No pertinent surgical history. Medication List:  Current Outpatient Medications  Medication Sig Dispense  Refill  . albuterol (VENTOLIN HFA) 108 (90 Base) MCG/ACT inhaler Inhale 2 puffs into the lungs every 4 (four) hours as needed for wheezing or shortness of breath  (coughing fits). 8 g 2  . Vitamin D, Ergocalciferol, (DRISDOL) 1.25 MG (50000 UNIT) CAPS capsule Take 1 capsule (50,000 Units total) by mouth every 7 (seven) days. 12 capsule 0   No current facility-administered medications for this visit.   Allergies: Allergies  Allergen Reactions  . Fruit & Vegetable Daily [Nutritional Supplements]     SOME FRUITS, STRAWBERRY,BANANAS, APPLES   Social History: Social History   Socioeconomic History  . Marital status: Single    Spouse name: Not on file  . Number of children: Not on file  . Years of education: Not on file  . Highest education level: Not on file  Occupational History  . Not on file  Tobacco Use  . Smoking status: Never Smoker  . Smokeless tobacco: Never Used  Substance and Sexual Activity  . Alcohol use: Yes  . Drug use: Never  . Sexual activity: Yes  Other Topics Concern  . Not on file  Social History Narrative  . Not on file   Social Determinants of Health   Financial Resource Strain: Not on file  Food Insecurity: Not on file  Transportation Needs: Not on file  Physical Activity: Not on file  Stress: Not on file  Social Connections: Not on file   Lives in an apartment. Smoking: denies Occupation: Community education officer HistorySurveyor, minerals in the house: no Carpet in the family room: no Carpet in the bedroom: yes Heating: electric Cooling: central Pet: yes 1 dog  Family History: Family History  Problem Relation Age of Onset  . Diabetes Father   . Hypertension Father   . Arthritis Maternal Grandmother   . Hypertension Maternal Grandfather   . Asthma Mother   . Asthma Brother   . Eczema Brother    Review of Systems  Constitutional: Negative for appetite change, chills, fever and unexpected weight change.  HENT: Negative for congestion and rhinorrhea.   Eyes: Negative for itching.  Respiratory: Negative for cough, chest tightness, shortness of breath and wheezing.   Cardiovascular: Negative  for chest pain.  Gastrointestinal: Negative for abdominal pain.  Genitourinary: Negative for difficulty urinating.  Skin: Negative for rash.  Allergic/Immunologic: Positive for environmental allergies and food allergies.  Neurological: Negative for headaches.   Objective: BP 110/62   Pulse 83   Temp 97.8 F (36.6 C) (Temporal)   Resp 16   Ht 5\' 7"  (1.702 m)   Wt 162 lb 6.4 oz (73.7 kg)   SpO2 99%   BMI 25.44 kg/m  Body mass index is 25.44 kg/m. Physical Exam Vitals and nursing note reviewed.  Constitutional:      Appearance: Normal appearance. He is well-developed.  HENT:     Head: Normocephalic and atraumatic.     Right Ear: Tympanic membrane and external ear normal.     Left Ear: Tympanic membrane and external ear normal.     Nose: Congestion and rhinorrhea present.     Mouth/Throat:     Mouth: Mucous membranes are moist.     Pharynx: Oropharynx is clear.  Eyes:     Conjunctiva/sclera: Conjunctivae normal.  Cardiovascular:     Rate and Rhythm: Normal rate and regular rhythm.     Heart sounds: Normal heart sounds. No murmur heard. No friction rub. No gallop.   Pulmonary:     Effort: Pulmonary effort  is normal.     Breath sounds: Normal breath sounds. No wheezing, rhonchi or rales.  Musculoskeletal:     Cervical back: Neck supple.  Skin:    General: Skin is warm.     Findings: No rash.  Neurological:     Mental Status: He is alert and oriented to person, place, and time.  Psychiatric:        Behavior: Behavior normal.    The plan was reviewed with the patient/family, and all questions/concerned were addressed.  It was my pleasure to see Isac today and participate in his care. Please feel free to contact me with any questions or concerns.  Sincerely,  Wyline Mood, DO Allergy & Immunology  [facility] office: [phone] [facility] office: [phone]

## 2021-01-13 ENCOUNTER — Other Ambulatory Visit: Payer: Self-pay

## 2021-01-13 ENCOUNTER — Encounter: Payer: Self-pay | Admitting: Allergy

## 2021-01-13 ENCOUNTER — Ambulatory Visit: Payer: 59 | Admitting: Allergy

## 2021-01-13 VITALS — BP 110/62 | HR 83 | Temp 97.8°F | Resp 16 | Ht 67.0 in | Wt 162.4 lb

## 2021-01-13 DIAGNOSIS — J4520 Mild intermittent asthma, uncomplicated: Secondary | ICD-10-CM | POA: Diagnosis not present

## 2021-01-13 DIAGNOSIS — T781XXD Other adverse food reactions, not elsewhere classified, subsequent encounter: Secondary | ICD-10-CM | POA: Insufficient documentation

## 2021-01-13 DIAGNOSIS — J3089 Other allergic rhinitis: Secondary | ICD-10-CM | POA: Insufficient documentation

## 2021-01-13 MED ORDER — ALBUTEROL SULFATE HFA 108 (90 BASE) MCG/ACT IN AERS
2.0000 | INHALATION_SPRAY | RESPIRATORY_TRACT | 2 refills | Status: DC | PRN
Start: 2021-01-13 — End: 2022-07-13

## 2021-01-13 NOTE — Assessment & Plan Note (Signed)
Some wheezing during the spring and uses albuterol prn with good benefit. One asthma flare in [place] requiring ER visit a few years ago.   Today's spirometry was normal. . May use albuterol rescue inhaler 2 puffs every 4 to 6 hours as needed for shortness of breath, chest tightness, coughing, and wheezing. Monitor frequency of use.

## 2021-01-13 NOTE — Patient Instructions (Addendum)
Today's skin testing showed:  Positive to grass, trees, ragweed, weed, dust mites, cat. Borderline to dog.  Positive to almond, hazelnut. Borderline to peanut, bananas, peach.  Results given.   Food:  Continue to avoid foods that are bothersome.  Discussed that his food triggered oral and throat symptoms are likely caused by oral food allergy syndrome (OFAS). This is caused by cross reactivity of pollen with fresh fruits and vegetables, and nuts. Symptoms are usually localized in the form of itching and burning in mouth and throat. Very rarely it can progress to more severe symptoms. Eating foods in cooked or processed forms usually minimizes symptoms. I recommended avoidance of eating the problem foods, especially during the peak season(s). Sometimes, OFAS can induce severe throat swelling or even a systemic reaction; with such instance, I advised them to report to a local ER. A list of common pollens and food cross-reactivities was provided to the patient.   Environmental allergies  Start environmental control measures as below.  Use over the counter antihistamines such as Zyrtec (cetirizine), Claritin (loratadine), Allegra (fexofenadine), or Xyzal (levocetirizine) daily as needed. May take twice a day during allergy flares. May switch antihistamines every few months.  Consider allergy injections for long term control if above medications do not help the symptoms - handout given.   Let us know when ready to start injections.   Asthma: . Normal breathing test today.  . May use albuterol rescue inhaler 2 puffs every 4 to 6 hours as needed for shortness of breath, chest tightness, coughing, and wheezing. Monitor frequency of use.  . Asthma control goals:  o Full participation in all desired activities (may need albuterol before activity) o Albuterol use two times or less a week on average (not counting use with activity) o Cough interfering with sleep two times or less a month o Oral  steroids no more than once a year o No hospitalizations  Follow up in 6 months or sooner if needed.   Reducing Pollen Exposure . Pollen seasons: trees (spring), grass (summer) and ragweed/weeds (fall). Marland Kitchen Keep windows closed in your home and car to lower pollen exposure.  Lilian Kapur air conditioning in the bedroom and throughout the house if possible.  . Avoid going out in dry windy days - especially early morning. . Pollen counts are highest between 5 - 10 AM and on dry, hot and windy days.  . Save outside activities for late afternoon or after a heavy rain, when pollen levels are lower.  . Avoid mowing of grass if you have grass pollen allergy. Marland Kitchen Be aware that pollen can also be transported indoors on people and pets.  . Dry your clothes in an automatic dryer rather than hanging them outside where they might collect pollen.  . Rinse hair and eyes before bedtime.  Control of House Dust Mite Allergen . Dust mite allergens are a common trigger of allergy and asthma symptoms. While they can be found throughout the house, these microscopic creatures thrive in warm, humid environments such as bedding, upholstered furniture and carpeting. . Because so much time is spent in the bedroom, it is essential to reduce mite levels there.  . Encase pillows, mattresses, and box springs in special allergen-proof fabric covers or airtight, zippered plastic covers.  . Bedding should be washed weekly in hot water (130 F) and dried in a hot dryer. Allergen-proof covers are available for comforters and pillows that can't be regularly washed.  Reyes Ivan the allergy-proof covers every few months.  Minimize clutter in the bedroom. Keep pets out of the bedroom.  Marland Kitchen Keep humidity less than 50% by using a dehumidifier or air conditioning. You can buy a humidity measuring device called a hygrometer to monitor this.  . If possible, replace carpets with hardwood, linoleum, or washable area rugs. If that's not possible, vacuum  frequently with a vacuum that has a HEPA filter. . Remove all upholstered furniture and non-washable window drapes from the bedroom. . Remove all non-washable stuffed toys from the bedroom.  Wash stuffed toys weekly.  Pet Allergen Avoidance: . Contrary to popular opinion, there are no "hypoallergenic" breeds of dogs or cats. That is because people are not allergic to an animal's hair, but to an allergen found in the animal's saliva, dander (dead skin flakes) or urine. Pet allergy symptoms typically occur within minutes. For some people, symptoms can build up and become most severe 8 to 12 hours after contact with the animal. People with severe allergies can experience reactions in public places if dander has been transported on the pet owners' clothing. Marland Kitchen Keeping an animal outdoors is only a partial solution, since homes with pets in the yard still have higher concentrations of animal allergens. . Before getting a pet, ask your allergist to determine if you are allergic to animals. If your pet is already considered part of your family, try to minimize contact and keep the pet out of the bedroom and other rooms where you spend a great deal of time. . As with dust mites, vacuum carpets often or replace carpet with a hardwood floor, tile or linoleum. . High-efficiency particulate air (HEPA) cleaners can reduce allergen levels over time. . While dander and saliva are the source of cat and dog allergens, urine is the source of allergens from rabbits, hamsters, mice and [place] pigs; so ask a non-allergic family member to clean the animal's cage. . If you have a pet allergy, talk to your allergist about the potential for allergy immunotherapy (allergy shots). This strategy can often provide long-term relief.

## 2021-01-13 NOTE — Assessment & Plan Note (Signed)
Patient noted perioral pruritus and facial pruritus after eating fresh fruits, mangoes, peanuts, tree nuts. Tolerates processed forms.  Symptoms resolved within 30 minutes without any medications.   Today's skin testing showed: Positive to almond, hazelnut. Borderline to peanut, bananas, peach. Negative to other select foods.   Continue to avoid foods that are bothersome.  Discussed that his food triggered oral and throat symptoms are likely caused by oral food allergy syndrome (OFAS). This is caused by cross reactivity of pollen with fresh fruits and vegetables, and nuts. Symptoms are usually localized in the form of itching and burning in mouth and throat. Very rarely it can progress to more severe symptoms. Eating foods in cooked or processed forms usually minimizes symptoms. I recommended avoidance of eating the problem foods, especially during the peak season(s). Sometimes, OFAS can induce severe throat swelling or even a systemic reaction; with such instance, I advised them to report to a local ER. A list of common pollens and food cross-reactivities was provided to the patient.   Offered bloodwork for mango as there is no skin prick testing for that - patient declined.

## 2021-01-13 NOTE — Assessment & Plan Note (Signed)
Rhinitis symptoms mainly in the spring for many years.  Using Zyrtec and Allegra with good benefit.  Skin testing in the past showed positive to trees per patient report.  No prior allergy immunotherapy. 1 dog at home.  Today's skin testing showed: Positive to grass, trees, ragweed, weed, dust mites, cat. Borderline to dog.  Start environmental control measures as below.  Use over the counter antihistamines such as Zyrtec (cetirizine), Claritin (loratadine), Allegra (fexofenadine), or Xyzal (levocetirizine) daily as needed. May take twice a day during allergy flares. May switch antihistamines every few months.  Consider allergy injections for long term control if above medications do not help the symptoms - handout given.   Discussed with patient that sometimes allergy immunotherapy helps the oral allergy syndrome as well.   Let us know when ready to start injections.

## 2021-01-13 NOTE — Addendum Note (Signed)
Addended by: Robet Leu A on: 01/13/2021 05:23 PM   Modules accepted: Orders

## 2021-02-08 ENCOUNTER — Other Ambulatory Visit: Payer: Self-pay | Admitting: Family Medicine

## 2021-02-08 DIAGNOSIS — E559 Vitamin D deficiency, unspecified: Secondary | ICD-10-CM

## 2021-03-20 ENCOUNTER — Institutional Professional Consult (permissible substitution): Payer: 59 | Admitting: Neurology

## 2021-04-12 ENCOUNTER — Ambulatory Visit: Payer: 59 | Admitting: Neurology

## 2021-04-18 ENCOUNTER — Telehealth: Payer: Self-pay | Admitting: Allergy

## 2021-04-18 NOTE — Telephone Encounter (Signed)
I spoke with Charles Harris and confirmed that the insurance has been filed with [employer] and patient had not met his deductible yet. About the EOB, that would be send out by his insurance company, not [name]. I called patient, but had to leave a message with all the above information.I also told him he might want to call the insurance company and talk to them about the EOB and to confirm with them that they have the correct address for the patient. I left my name and number and told him if he needed further information, to please call me.

## 2021-04-18 NOTE — Telephone Encounter (Signed)
Patient called about his bill #[id number] and would like to know if we filed his ins. Because he has not received his EOB yet. [phone].

## 2021-04-21 NOTE — Telephone Encounter (Signed)
Patient called back to ask why his insurance did not pay anything for his visit. I let him know it was because he had not met his deductible.  He would like to know if he could set up a payment plan. I told him I would sent a request to [place].

## 2021-05-29 ENCOUNTER — Telehealth (INDEPENDENT_AMBULATORY_CARE_PROVIDER_SITE_OTHER): Payer: 59 | Admitting: Family Medicine

## 2021-05-29 ENCOUNTER — Encounter: Payer: Self-pay | Admitting: Family Medicine

## 2021-05-29 VITALS — BP 130/91 | HR 55

## 2021-05-29 DIAGNOSIS — H9201 Otalgia, right ear: Secondary | ICD-10-CM

## 2021-05-29 DIAGNOSIS — G44209 Tension-type headache, unspecified, not intractable: Secondary | ICD-10-CM

## 2021-05-29 DIAGNOSIS — G4700 Insomnia, unspecified: Secondary | ICD-10-CM

## 2021-05-29 DIAGNOSIS — H6981 Other specified disorders of Eustachian tube, right ear: Secondary | ICD-10-CM

## 2021-05-29 DIAGNOSIS — R030 Elevated blood-pressure reading, without diagnosis of hypertension: Secondary | ICD-10-CM | POA: Diagnosis not present

## 2021-05-29 NOTE — Progress Notes (Signed)
Virtual Visit via Video Note  I connected with Charles Harris, DDS on 05/29/21 at  4:00 PM EDT by a video enabled telemedicine application 2/2 COVID-19 pandemic and verified that I am speaking with the correct person using two identifiers.  Location patient: home Location provider:work or home office Persons participating in the virtual visit: patient, provider  I discussed the limitations of evaluation and management by telemedicine and the availability of in person appointments. The patient expressed understanding and agreed to proceed.   HPI: Pt is a 30 yo male with pmh sig for h/o asthma who was seen today for ongoing concern.  Pt with a daily HA x 5 wks.  Sensation noted as a steady discomfort of b/l temples and occipital area. Denies changes in vision, n/v, sharp pain, increased stress.  Sensation starts around 10 am and continues through the day.  Tried sudafed and advil.  Advil may have helped some.  Pt drinks coffee.  Drinking less water than normally does.  Sleep is interrupted.  Wakes up several times per night due to dreams. At times wakes up feeling sedated.  Denies snoring, difficulty falling asleep, grinding or clenching teeth at night.  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  Pt noticed brief R ear pain when laying on his R side.  The pain can trigger a HA.  ROS: See pertinent positives and negatives per HPI.  Past Medical History:  Diagnosis Date   Asthma    Urticaria     No past surgical history on file.  Family History  Problem Relation Age of Onset   Diabetes Father    Hypertension Father    Arthritis Maternal Grandmother    Hypertension Maternal Grandfather    Asthma Mother    Asthma Brother    Eczema Brother     Current Outpatient Medications:    albuterol (VENTOLIN HFA) 108 (90 Base) MCG/ACT inhaler, Inhale 2 puffs into the lungs every 4 (four) hours as needed for wheezing or shortness of breath (coughing fits)., Disp: 8 g, Rfl: 2    Vitamin D, Ergocalciferol, (DRISDOL) 1.25 MG (50000 UNIT) CAPS capsule, TAKE 1 CAPSULE BY MOUTH EVERY 7 DAYS (Patient not taking: Reported on 05/29/2021), Disp: 12 capsule, Rfl: 0  EXAM:  VITALS per patient if applicable: RR between 12-20 bpm, bp 130/91  GENERAL: alert, oriented, appears well and in no acute distress  HEENT: atraumatic, conjunctiva clear, no obvious abnormalities on inspection of external nose and ears  NECK: normal movements of the head and neck  LUNGS: on inspection no signs of respiratory distress, breathing rate appears normal, no obvious gross SOB, gasping or wheezing  CV: no obvious cyanosis  MS: moves all visible extremities without noticeable abnormality  PSYCH/NEURO: pleasant and cooperative, no obvious depression or anxiety, speech and thought processing grossly intact  ASSESSMENT AND PLAN:  Discussed the following assessment and plan:  Tension headache -likely given HA description.  Posture at work and lack of sleep may also be contributing to daily headaches. -Discussed sleep study.  Patient has an opportunity to have one done in the next few months.   -BP elevated this visit.  Patient encouraged to check BP daily as it may be contributing to her frequent headaches. -Given precautions -For continued or worsening symptoms consider imaging  Right ear pain -discussed possible causes including eustachian tube dysfunction and acute AOM. -We will try OTC antihistamines. -For continued or worsening symptoms in person visit for further evaluation.  Eustachian tube dysfunction, right -Discussed various causes -We will try OTC antihistamine such as Zyrtec, Claritin, Allegra or nasal spray such as Flonase.  Elevated blood pressure reading in office without diagnosis of hypertension -bp 130/91 -discussed increasing po intake of water -Continue lifestyle modifications -Patient encouraged to check BP for the next few days as may be contributing to  headache.  Frequent nocturnal awakenings -Consider sleep study   F/u in the next wk for continued symptoms  I discussed the assessment and treatment plan with the patient. The patient was provided an opportunity to ask questions and all were answered. The patient agreed with the plan and demonstrated an understanding of the instructions.   The patient was advised to call back or seek an in-person evaluation if the symptoms worsen or if the condition fails to improve as anticipated.   Deeann Saint, MD

## 2021-06-16 DIAGNOSIS — F411 Generalized anxiety disorder: Secondary | ICD-10-CM | POA: Diagnosis not present

## 2021-07-17 ENCOUNTER — Ambulatory Visit: Payer: 59 | Admitting: Allergy

## 2021-07-17 DIAGNOSIS — J309 Allergic rhinitis, unspecified: Secondary | ICD-10-CM

## 2021-07-17 NOTE — Progress Notes (Deleted)
Follow Up Note  RE: Del Overfelt MRN: [id number] DOB: 04-20-1991 Date of Office Visit: 07/17/2021  Referring provider: Deeann Saint, MD Primary care provider: Deeann Saint, MD  Chief Complaint: No chief complaint on file.  History of Present Illness: I had the pleasure of seeing Charles Harris for a follow up visit at the [facility] on 07/17/2021. He is a 30 y.o. male, who is being followed for food allergy, allergic rhinitis and asthma. His previous allergy office visit was on 01/13/2021 with Dr. Selena Harris. Today is a regular follow up visit.  Other adverse food reactions, not elsewhere classified, subsequent encounter Patient noted perioral pruritus and facial pruritus after eating fresh fruits, mangoes, peanuts, tree nuts. Tolerates processed forms.  Symptoms resolved within 30 minutes without any medications.  Today's skin testing showed: Positive to almond, hazelnut. Borderline to peanut, bananas, peach. Negative to other select foods.  Continue to avoid foods that are bothersome. Discussed that his food triggered oral and throat symptoms are likely caused by oral food allergy syndrome (OFAS). This is caused by cross reactivity of pollen with fresh fruits and vegetables, and nuts. Symptoms are usually localized in the form of itching and burning in mouth and throat. Very rarely it can progress to more severe symptoms. Eating foods in cooked or processed forms usually minimizes symptoms. I recommended avoidance of eating the problem foods, especially during the peak season(s). Sometimes, OFAS can induce severe throat swelling or even a systemic reaction; with such instance, I advised them to report to a local ER. A list of common pollens and food cross-reactivities was provided to the patient.  Offered bloodwork for mango as there is no skin prick testing for that - patient declined.    Other allergic rhinitis Rhinitis symptoms mainly in the spring for many years.  Using  Zyrtec and Allegra with good benefit.  Skin testing in the past showed positive to trees per patient report.  No prior allergy immunotherapy. 1 dog at home. Today's skin testing showed: Positive to grass, trees, ragweed, weed, dust mites, cat. Borderline to dog. Start environmental control measures as below. Use over the counter antihistamines such as Zyrtec (cetirizine), Claritin (loratadine), Allegra (fexofenadine), or Xyzal (levocetirizine) daily as needed. May take twice a day during allergy flares. May switch antihistamines every few months. Consider allergy injections for long term control if above medications do not help the symptoms - handout given.  Discussed with patient that sometimes allergy immunotherapy helps the oral allergy syndrome as well.  Let us know when ready to start injections.    Mild intermittent asthma without complication Some wheezing during the spring and uses albuterol prn with good benefit. One asthma flare in [place] requiring ER visit a few years ago.  Today's spirometry was normal. May use albuterol rescue inhaler 2 puffs every 4 to 6 hours as needed for shortness of breath, chest tightness, coughing, and wheezing. Monitor frequency of use.    Return in about 6 months (around 07/15/2021).  Assessment and Plan: Aneudy is a 30 y.o. male with: No problem-specific Assessment & Plan notes found for this encounter.  No follow-ups on file.  No orders of the defined types were placed in this encounter.  Lab Orders  No laboratory test(s) ordered today    Diagnostics: Spirometry:  Tracings reviewed. His effort: {Blank single:19197::"Good reproducible efforts.","It was hard to get consistent efforts and there is a question as to whether this reflects a maximal maneuver.","Poor effort, data  can not be interpreted."} FVC: ***L FEV1: ***L, ***% predicted FEV1/FVC ratio: ***% Interpretation: {Blank single:19197::"Spirometry consistent with mild obstructive  disease","Spirometry consistent with moderate obstructive disease","Spirometry consistent with severe obstructive disease","Spirometry consistent with possible restrictive disease","Spirometry consistent with mixed obstructive and restrictive disease","Spirometry uninterpretable due to technique","Spirometry consistent with normal pattern","No overt abnormalities noted given today's efforts"}.  Please see scanned spirometry results for details.  Skin Testing: {Blank single:19197::"Select foods","Environmental allergy panel","Environmental allergy panel and select foods","Food allergy panel","None","Deferred due to recent antihistamines use"}. *** Results discussed with patient/family.   Medication List:  Current Outpatient Medications  Medication Sig Dispense Refill   albuterol (VENTOLIN HFA) 108 (90 Base) MCG/ACT inhaler Inhale 2 puffs into the lungs every 4 (four) hours as needed for wheezing or shortness of breath (coughing fits). 8 g 2   Vitamin D, Ergocalciferol, (DRISDOL) 1.25 MG (50000 UNIT) CAPS capsule TAKE 1 CAPSULE BY MOUTH EVERY 7 DAYS (Patient not taking: Reported on 05/29/2021) 12 capsule 0   No current facility-administered medications for this visit.   Allergies: Allergies  Allergen Reactions   Fruit & Vegetable Daily [Nutritional Supplements]     SOME FRUITS, STRAWBERRY,BANANAS, APPLES   I reviewed his past medical history, social history, family history, and environmental history and no significant changes have been reported from his previous visit.  Review of Systems  Constitutional:  Negative for appetite change, chills, fever and unexpected weight change.  HENT:  Negative for congestion and rhinorrhea.   Eyes:  Negative for itching.  Respiratory:  Negative for cough, chest tightness, shortness of breath and wheezing.   Cardiovascular:  Negative for chest pain.  Gastrointestinal:  Negative for abdominal pain.  Genitourinary:  Negative for difficulty urinating.   Skin:  Negative for rash.  Allergic/Immunologic: Positive for environmental allergies and food allergies.  Neurological:  Negative for headaches.   Objective: There were no vitals taken for this visit. There is no height or weight on file to calculate BMI. Physical Exam Vitals and nursing note reviewed.  Constitutional:      Appearance: Normal appearance. He is well-developed.  HENT:     Head: Normocephalic and atraumatic.     Right Ear: Tympanic membrane and external ear normal.     Left Ear: Tympanic membrane and external ear normal.     Nose: Congestion and rhinorrhea present.     Mouth/Throat:     Mouth: Mucous membranes are moist.     Pharynx: Oropharynx is clear.  Eyes:     Conjunctiva/sclera: Conjunctivae normal.  Cardiovascular:     Rate and Rhythm: Normal rate and regular rhythm.     Heart sounds: Normal heart sounds. No murmur heard.   No friction rub. No gallop.  Pulmonary:     Effort: Pulmonary effort is normal.     Breath sounds: Normal breath sounds. No wheezing, rhonchi or rales.  Musculoskeletal:     Cervical back: Neck supple.  Skin:    General: Skin is warm.     Findings: No rash.  Neurological:     Mental Status: He is alert and oriented to person, place, and time.  Psychiatric:        Behavior: Behavior normal.   Previous notes and tests were reviewed. The plan was reviewed with the patient/family, and all questions/concerned were addressed.  It was my pleasure to see Charles Harris today and participate in his care. Please feel free to contact me with any questions or concerns.  Sincerely,  Wyline Mood, DO Allergy & Immunology  [facility] office: [phone] [place] office: [phone]

## 2021-07-21 DIAGNOSIS — F411 Generalized anxiety disorder: Secondary | ICD-10-CM | POA: Diagnosis not present

## 2021-11-24 ENCOUNTER — Ambulatory Visit (INDEPENDENT_AMBULATORY_CARE_PROVIDER_SITE_OTHER): Payer: BC Managed Care – PPO

## 2021-11-24 ENCOUNTER — Encounter: Payer: Self-pay | Admitting: Family Medicine

## 2021-11-24 ENCOUNTER — Ambulatory Visit (INDEPENDENT_AMBULATORY_CARE_PROVIDER_SITE_OTHER): Payer: BC Managed Care – PPO | Admitting: Family Medicine

## 2021-11-24 VITALS — BP 120/80 | HR 69 | Temp 98.1°F | Ht 67.0 in | Wt 165.6 lb

## 2021-11-24 DIAGNOSIS — Z113 Encounter for screening for infections with a predominantly sexual mode of transmission: Secondary | ICD-10-CM

## 2021-11-24 DIAGNOSIS — M5412 Radiculopathy, cervical region: Secondary | ICD-10-CM | POA: Diagnosis not present

## 2021-11-24 DIAGNOSIS — M5416 Radiculopathy, lumbar region: Secondary | ICD-10-CM

## 2021-11-24 DIAGNOSIS — G4700 Insomnia, unspecified: Secondary | ICD-10-CM | POA: Diagnosis not present

## 2021-11-24 DIAGNOSIS — R5383 Other fatigue: Secondary | ICD-10-CM | POA: Diagnosis not present

## 2021-11-24 DIAGNOSIS — H66002 Acute suppurative otitis media without spontaneous rupture of ear drum, left ear: Secondary | ICD-10-CM

## 2021-11-24 DIAGNOSIS — Z0000 Encounter for general adult medical examination without abnormal findings: Secondary | ICD-10-CM

## 2021-11-24 LAB — HEMOGLOBIN A1C: Hgb A1c MFr Bld: 5.9 % (ref 4.6–6.5)

## 2021-11-24 LAB — LIPID PANEL
Cholesterol: 174 mg/dL (ref 0–200)
HDL: 68.3 mg/dL (ref >39.00)
LDL Cholesterol: 97 mg/dL (ref 0–99)
NonHDL: 105.61
Total CHOL/HDL Ratio: 3
Triglycerides: 44 mg/dL (ref 0.0–149.0)
VLDL: 8.8 mg/dL (ref 0.0–40.0)

## 2021-11-24 LAB — COMPREHENSIVE METABOLIC PANEL
ALT: 12 U/L (ref 0–53)
AST: 16 U/L (ref 0–37)
Albumin: 4.5 g/dL (ref 3.5–5.2)
Alkaline Phosphatase: 66 U/L (ref 39–117)
BUN: 20 mg/dL (ref 6–23)
CO2: 29 mEq/L (ref 19–32)
Calcium: 9.8 mg/dL (ref 8.4–10.5)
Chloride: 103 mEq/L (ref 96–112)
Creatinine, Ser: 1.07 mg/dL (ref 0.40–1.50)
GFR: 93.66 mL/min (ref >60.00)
Glucose, Bld: 93 mg/dL (ref 70–99)
Potassium: 4.4 mEq/L (ref 3.5–5.1)
Sodium: 137 mEq/L (ref 135–145)
Total Bilirubin: 0.7 mg/dL (ref 0.2–1.2)
Total Protein: 7.3 g/dL (ref 6.0–8.3)

## 2021-11-24 LAB — TSH: TSH: 1.05 u[IU]/mL (ref 0.35–5.50)

## 2021-11-24 LAB — CBC WITH DIFFERENTIAL/PLATELET
Basophils Absolute: 0 10*3/uL (ref 0.0–0.1)
Basophils Relative: 0.9 % (ref 0.0–3.0)
Eosinophils Absolute: 0.1 10*3/uL (ref 0.0–0.7)
Eosinophils Relative: 1.6 % (ref 0.0–5.0)
HCT: 45.4 % (ref 39.0–52.0)
Hemoglobin: 15.1 g/dL (ref 13.0–17.0)
Lymphocytes Relative: 47.9 % — ABNORMAL HIGH (ref 12.0–46.0)
Lymphs Abs: 1.9 10*3/uL (ref 0.7–4.0)
MCHC: 33.3 g/dL (ref 30.0–36.0)
MCV: 89.8 fl (ref 78.0–100.0)
Monocytes Absolute: 0.5 10*3/uL (ref 0.1–1.0)
Monocytes Relative: 11.2 % (ref 3.0–12.0)
Neutro Abs: 1.6 10*3/uL (ref 1.4–7.7)
Neutrophils Relative %: 38.4 % — ABNORMAL LOW (ref 43.0–77.0)
Platelets: 267 10*3/uL (ref 150.0–400.0)
RBC: 5.05 Mil/uL (ref 4.22–5.81)
RDW: 14.2 % (ref 11.5–15.5)
WBC: 4 10*3/uL (ref 4.0–10.5)

## 2021-11-24 LAB — T4, FREE: Free T4: 0.87 ng/dL (ref 0.60–1.60)

## 2021-11-24 LAB — VITAMIN B12: Vitamin B-12: 417 pg/mL (ref 211–911)

## 2021-11-24 LAB — VITAMIN D 25 HYDROXY (VIT D DEFICIENCY, FRACTURES): VITD: 17.9 ng/mL — ABNORMAL LOW (ref 30.00–100.00)

## 2021-11-24 MED ORDER — AMOXICILLIN-POT CLAVULANATE 500-125 MG PO TABS: 1.0000 | ORAL_TABLET | Freq: Two times a day (BID) | ORAL | 0 refills | Status: AC
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
## 2021-11-26 ENCOUNTER — Other Ambulatory Visit: Payer: Self-pay | Admitting: Family Medicine

## 2021-11-26 DIAGNOSIS — E559 Vitamin D deficiency, unspecified: Secondary | ICD-10-CM | POA: Insufficient documentation

## 2021-11-26 MED ORDER — VITAMIN D (ERGOCALCIFEROL) 1.25 MG (50000 UNIT) PO CAPS: 50000.0000 [IU] | ORAL_CAPSULE | ORAL | 0 refills | Status: DC

## 2021-11-27 LAB — C. TRACHOMATIS/N. GONORRHOEAE RNA
C. trachomatis RNA, TMA: NOT DETECTED
N. gonorrhoeae RNA, TMA: NOT DETECTED

## 2021-11-27 LAB — HIV ANTIBODY (ROUTINE TESTING W REFLEX): HIV 1&2 Ab, 4th Generation: NONREACTIVE

## 2021-11-27 LAB — RPR: RPR Ser Ql: NONREACTIVE

## 2021-12-01 ENCOUNTER — Encounter: Payer: Self-pay | Admitting: Family Medicine

## 2022-02-02 DIAGNOSIS — F4320 Adjustment disorder, unspecified: Secondary | ICD-10-CM | POA: Diagnosis not present

## 2022-02-16 ENCOUNTER — Other Ambulatory Visit: Payer: Self-pay | Admitting: Family Medicine

## 2022-02-16 DIAGNOSIS — E559 Vitamin D deficiency, unspecified: Secondary | ICD-10-CM

## 2022-02-16 DIAGNOSIS — F4320 Adjustment disorder, unspecified: Secondary | ICD-10-CM | POA: Diagnosis not present

## 2022-03-02 DIAGNOSIS — F4320 Adjustment disorder, unspecified: Secondary | ICD-10-CM | POA: Diagnosis not present

## 2022-03-09 DIAGNOSIS — F4320 Adjustment disorder, unspecified: Secondary | ICD-10-CM | POA: Diagnosis not present

## 2022-04-06 DIAGNOSIS — F4320 Adjustment disorder, unspecified: Secondary | ICD-10-CM | POA: Diagnosis not present

## 2022-04-20 DIAGNOSIS — F4320 Adjustment disorder, unspecified: Secondary | ICD-10-CM | POA: Diagnosis not present

## 2022-05-04 DIAGNOSIS — F4320 Adjustment disorder, unspecified: Secondary | ICD-10-CM | POA: Diagnosis not present

## 2022-05-25 DIAGNOSIS — F4320 Adjustment disorder, unspecified: Secondary | ICD-10-CM | POA: Diagnosis not present

## 2022-06-08 DIAGNOSIS — F4320 Adjustment disorder, unspecified: Secondary | ICD-10-CM | POA: Diagnosis not present

## 2022-06-22 DIAGNOSIS — F4320 Adjustment disorder, unspecified: Secondary | ICD-10-CM | POA: Diagnosis not present

## 2022-07-13 ENCOUNTER — Encounter: Payer: Self-pay | Admitting: Family Medicine

## 2022-07-13 ENCOUNTER — Ambulatory Visit (INDEPENDENT_AMBULATORY_CARE_PROVIDER_SITE_OTHER): Payer: BC Managed Care – PPO | Admitting: Family Medicine

## 2022-07-13 ENCOUNTER — Ambulatory Visit (INDEPENDENT_AMBULATORY_CARE_PROVIDER_SITE_OTHER): Payer: BC Managed Care – PPO

## 2022-07-13 VITALS — BP 144/80 | HR 75 | Temp 98.5°F | Wt 161.2 lb

## 2022-07-13 DIAGNOSIS — M25511 Pain in right shoulder: Secondary | ICD-10-CM

## 2022-07-13 DIAGNOSIS — M7521 Bicipital tendinitis, right shoulder: Secondary | ICD-10-CM

## 2022-07-13 MED ORDER — MELOXICAM 15 MG PO TABS: 15.0000 mg | ORAL_TABLET | Freq: Every day | ORAL | 0 refills | Status: AC

## 2022-07-13 NOTE — Progress Notes (Signed)
Subjective:    Patient ID: Charles Harris, male    DOB: 10-23-1990, 31 y.o.   MRN: [id number]  Chief Complaint  Patient presents with   Shoulder Pain    Pt reports Right shoulder pain from working out. Noticed 6 wks ago. Feels pain when put pressure on it.     HPI Patient was seen today for ongoing concern. Pt with R shoulder pain x 6 wks.  Pain now constant in anterior R shoulder and lateral shoulder with applying pressure.   Having some numbness in R hand.  Noted after lifting dumbbells overhead.  Pt has been resting arm.  Endorses history of popping in shoulder at baseline.  Now having painful popping.  Past Medical History:  Diagnosis Date   Asthma    Urticaria     Allergies  Allergen Reactions   Fruit & Vegetable Daily [Nutritional Supplements]     SOME FRUITS, STRAWBERRY,BANANAS, APPLES    ROS General: Denies fever, chills, night sweats, changes in weight, changes in appetite HEENT: Denies headaches, ear pain, changes in vision, rhinorrhea, sore throat CV: Denies CP, palpitations, SOB, orthopnea Pulm: Denies SOB, cough, wheezing GI: Denies abdominal pain, nausea, vomiting, diarrhea, constipation GU: Denies dysuria, hematuria, frequency Msk: Denies muscle cramps, joint pains  +R shoulder pain Neuro: Denies weakness, numbness, tingling +numbness in R hand Skin: Denies rashes, bruising Psych: Denies depression, anxiety, hallucinations     Objective:    Blood pressure (!) 144/80, pulse 75, temperature 98.5 F (36.9 C), temperature source Oral, weight 161 lb 3.2 oz (73.1 kg), SpO2 98 %.  Gen. Pleasant, well-nourished, in no distress, normal affect   HEENT: [place]/AT, face symmetric, conjunctiva clear, no scleral icterus, PERRLA, EOMI, nares patent without drainage Lungs: no accessory muscle use Cardiovascular: RRR, no peripheral edema Musculoskeletal: FROM active and passive.  Pain in anterior R shoulder at 90 degrees of Abduction at shoulder.  TTP along his biceps brachii  tendon and lateral shoulder at [facility] joint.  No TTP of cervical, thoracic, lumbar spine.  No paresthesias noted with axial loading.  Negative lift off.  No pain with external rotation of right arm against resistance. No deformities, no cyanosis or clubbing, normal tone Neuro:  A&Ox3, CN II-XII intact, normal gait Skin:  Warm, no lesions/ rash   Wt Readings from Last 3 Encounters:  07/13/22 161 lb 3.2 oz (73.1 kg)  11/24/21 165 lb 9.6 oz (75.1 kg)  01/13/21 162 lb 6.4 oz (73.7 kg)    Lab Results  Component Value Date   WBC 4.0 11/24/2021   HGB 15.1 11/24/2021   HCT 45.4 11/24/2021   PLT 267.0 11/24/2021   GLUCOSE 93 11/24/2021   CHOL 174 11/24/2021   TRIG 44.0 11/24/2021   HDL 68.30 11/24/2021   LDLCALC 97 11/24/2021   ALT 12 11/24/2021   AST 16 11/24/2021   NA 137 11/24/2021   K 4.4 11/24/2021   CL 103 11/24/2021   CREATININE 1.07 11/24/2021   BUN 20 11/24/2021   CO2 29 11/24/2021   TSH 1.05 11/24/2021   HGBA1C 5.9 11/24/2021    Assessment/Plan:  Tendonitis of long head of biceps brachii of right shoulder - Plan: DG Shoulder Right  Acute pain of right shoulder - Plan: DG Shoulder Right, meloxicam (MOBIC) 15 MG tablet  R shoulder pain x 6 wks, no improvement with rest.  Given tenderness of and biceps brachii treat for tendinitis anti-inflammatories, heat, stretching, other supportive care.  Rx for Mobic sent to pharmacy.  Will obtain  imaging for continued symptoms including popping with pain, and numbness in hand.  Consider shoulder separation, labral tear, arthritis.  Given home exercises. Consider physical therapy.  F/u as needed  Abbe Amsterdam, MD

## 2022-07-13 NOTE — Patient Instructions (Signed)
Supportive care including heat, massage, stretching, topical analgesic such as Aspercreme, icy hot, or biofreeze can be helpful.    A prescription for mobic was sent to your pharmacy.  It is an antiinflammatory medication.  If you have Ibuprofen at home you can use that instead.  It will likely take a few more weeks before you notice improvement in symptoms.

## 2022-07-20 DIAGNOSIS — F4320 Adjustment disorder, unspecified: Secondary | ICD-10-CM | POA: Diagnosis not present

## 2022-08-17 DIAGNOSIS — F4320 Adjustment disorder, unspecified: Secondary | ICD-10-CM | POA: Diagnosis not present

## 2022-08-27 ENCOUNTER — Ambulatory Visit: Payer: BC Managed Care – PPO | Admitting: Family Medicine

## 2022-09-07 DIAGNOSIS — F4320 Adjustment disorder, unspecified: Secondary | ICD-10-CM | POA: Diagnosis not present

## 2022-09-21 DIAGNOSIS — F4320 Adjustment disorder, unspecified: Secondary | ICD-10-CM | POA: Diagnosis not present

## 2022-10-05 DIAGNOSIS — F4322 Adjustment disorder with anxiety: Secondary | ICD-10-CM | POA: Diagnosis not present

## 2022-10-19 DIAGNOSIS — F4322 Adjustment disorder with anxiety: Secondary | ICD-10-CM | POA: Diagnosis not present

## 2022-11-02 DIAGNOSIS — F4322 Adjustment disorder with anxiety: Secondary | ICD-10-CM | POA: Diagnosis not present

## 2022-11-30 DIAGNOSIS — Z1329 Encounter for screening for other suspected endocrine disorder: Secondary | ICD-10-CM | POA: Diagnosis not present

## 2022-11-30 DIAGNOSIS — Z0000 Encounter for general adult medical examination without abnormal findings: Secondary | ICD-10-CM | POA: Diagnosis not present

## 2022-11-30 DIAGNOSIS — Z13220 Encounter for screening for lipoid disorders: Secondary | ICD-10-CM | POA: Diagnosis not present

## 2022-11-30 DIAGNOSIS — Z7251 High risk heterosexual behavior: Secondary | ICD-10-CM | POA: Diagnosis not present

## 2022-11-30 DIAGNOSIS — Z1330 Encounter for screening examination for mental health and behavioral disorders, unspecified: Secondary | ICD-10-CM | POA: Diagnosis not present

## 2022-11-30 DIAGNOSIS — S46812A Strain of other muscles, fascia and tendons at shoulder and upper arm level, left arm, initial encounter: Secondary | ICD-10-CM | POA: Diagnosis not present

## 2022-11-30 DIAGNOSIS — N5089 Other specified disorders of the male genital organs: Secondary | ICD-10-CM | POA: Diagnosis not present

## 2022-11-30 DIAGNOSIS — J4520 Mild intermittent asthma, uncomplicated: Secondary | ICD-10-CM | POA: Diagnosis not present

## 2022-11-30 DIAGNOSIS — Z113 Encounter for screening for infections with a predominantly sexual mode of transmission: Secondary | ICD-10-CM | POA: Diagnosis not present

## 2022-11-30 DIAGNOSIS — Z130 Encounter for screening for diseases of the blood and blood-forming organs and certain disorders involving the immune mechanism: Secondary | ICD-10-CM | POA: Diagnosis not present

## 2022-12-14 DIAGNOSIS — F4322 Adjustment disorder with anxiety: Secondary | ICD-10-CM | POA: Diagnosis not present

## 2022-12-19 DIAGNOSIS — H73893 Other specified disorders of tympanic membrane, bilateral: Secondary | ICD-10-CM | POA: Diagnosis not present

## 2022-12-19 DIAGNOSIS — H1031 Unspecified acute conjunctivitis, right eye: Secondary | ICD-10-CM | POA: Diagnosis not present

## 2022-12-21 DIAGNOSIS — M542 Cervicalgia: Secondary | ICD-10-CM | POA: Diagnosis not present

## 2022-12-28 DIAGNOSIS — F4322 Adjustment disorder with anxiety: Secondary | ICD-10-CM | POA: Diagnosis not present

## 2023-01-03 DIAGNOSIS — M5412 Radiculopathy, cervical region: Secondary | ICD-10-CM | POA: Diagnosis not present

## 2023-01-18 DIAGNOSIS — M7918 Myalgia, other site: Secondary | ICD-10-CM | POA: Diagnosis not present

## 2023-01-18 DIAGNOSIS — M79642 Pain in left hand: Secondary | ICD-10-CM | POA: Diagnosis not present

## 2023-01-18 DIAGNOSIS — M542 Cervicalgia: Secondary | ICD-10-CM | POA: Diagnosis not present

## 2023-05-08 DIAGNOSIS — R61 Generalized hyperhidrosis: Secondary | ICD-10-CM | POA: Diagnosis not present

## 2023-05-08 DIAGNOSIS — R7303 Prediabetes: Secondary | ICD-10-CM | POA: Diagnosis not present

## 2023-05-08 DIAGNOSIS — R002 Palpitations: Secondary | ICD-10-CM | POA: Diagnosis not present

## 2023-05-08 DIAGNOSIS — Z8249 Family history of ischemic heart disease and other diseases of the circulatory system: Secondary | ICD-10-CM | POA: Diagnosis not present

## 2023-05-10 DIAGNOSIS — F4322 Adjustment disorder with anxiety: Secondary | ICD-10-CM | POA: Diagnosis not present

## 2023-05-17 DIAGNOSIS — F4322 Adjustment disorder with anxiety: Secondary | ICD-10-CM | POA: Diagnosis not present

## 2023-05-31 DIAGNOSIS — F4322 Adjustment disorder with anxiety: Secondary | ICD-10-CM | POA: Diagnosis not present

## 2023-06-05 DIAGNOSIS — F411 Generalized anxiety disorder: Secondary | ICD-10-CM | POA: Diagnosis not present

## 2023-06-05 DIAGNOSIS — F4322 Adjustment disorder with anxiety: Secondary | ICD-10-CM | POA: Diagnosis not present

## 2023-06-07 IMAGING — DX DG LUMBAR SPINE COMPLETE 4+V
5 series · 5 of 5 positions shown · non-contrast
Comparison: None.

CLINICAL DATA: Left lower extremity lumbar radiculopathy.

EXAM:
LUMBAR SPINE - COMPLETE 4+ VIEW

[lumbar spine ap]
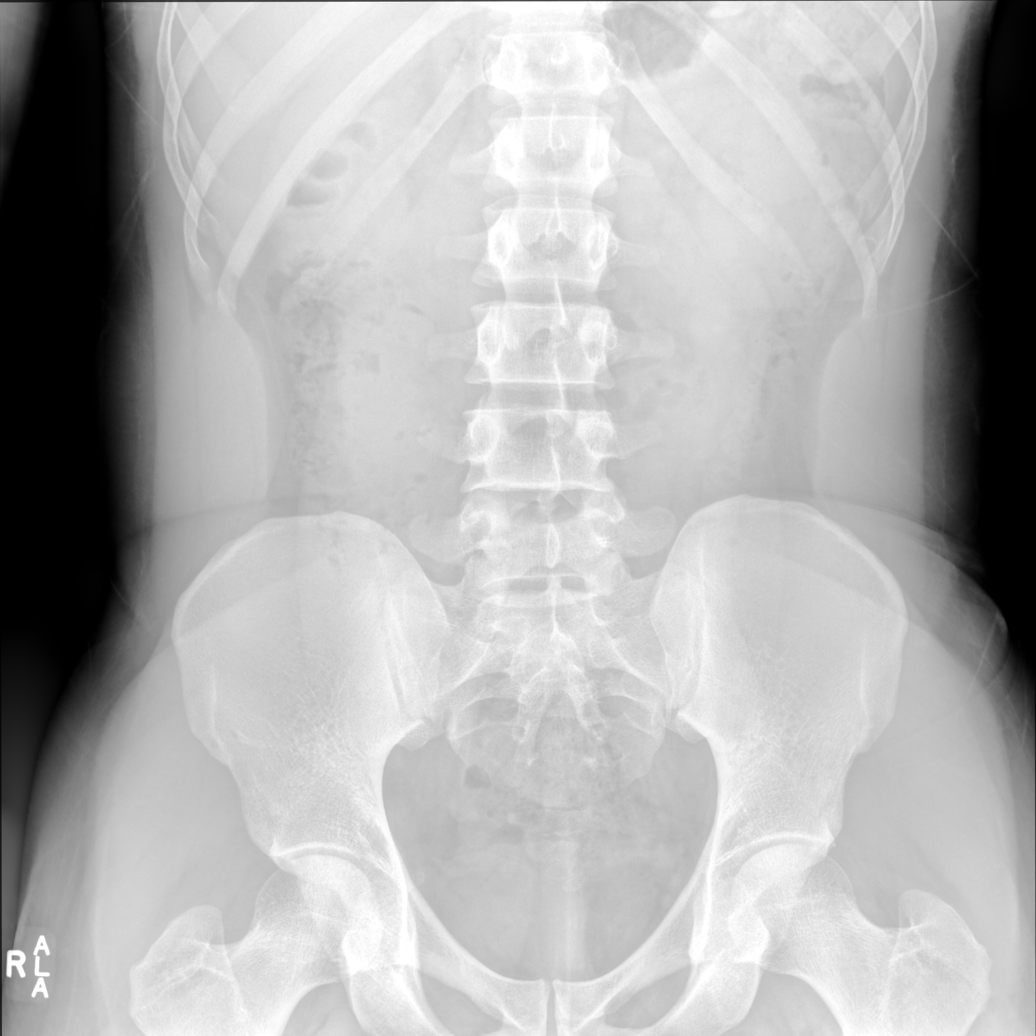

[lumbar spine oblique (1 of 2)]
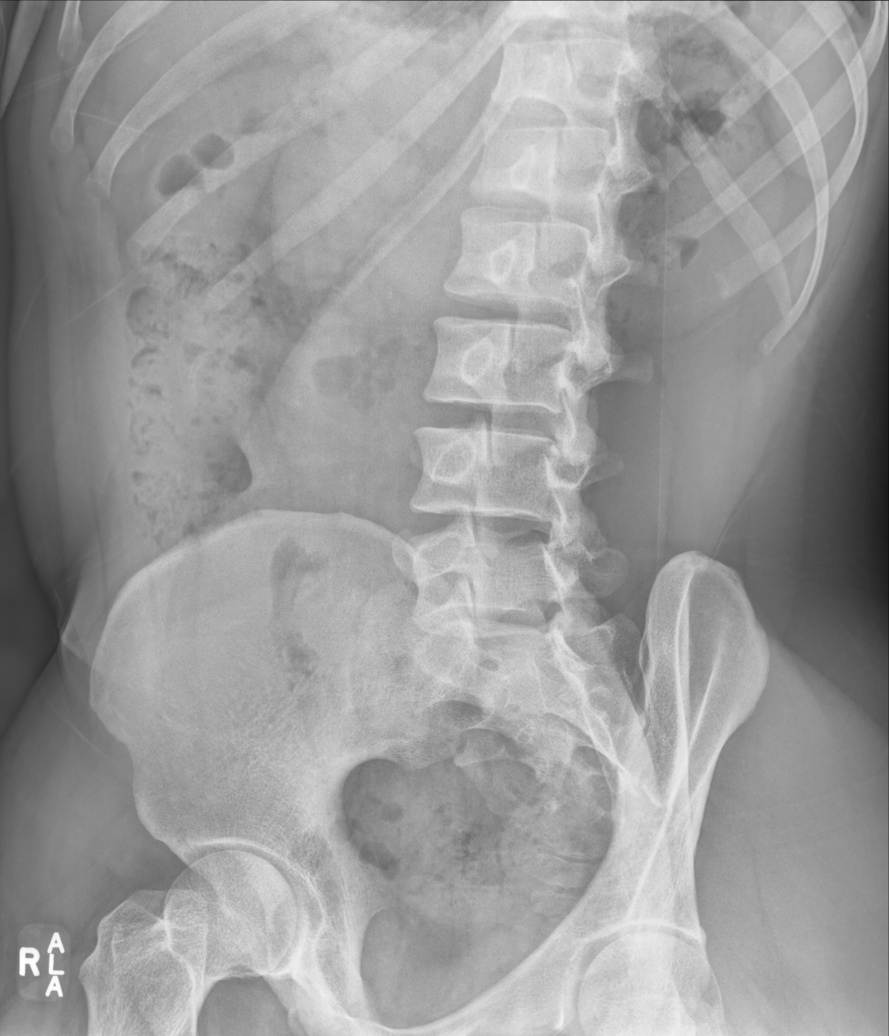

[lumbar spine oblique (2 of 2)]
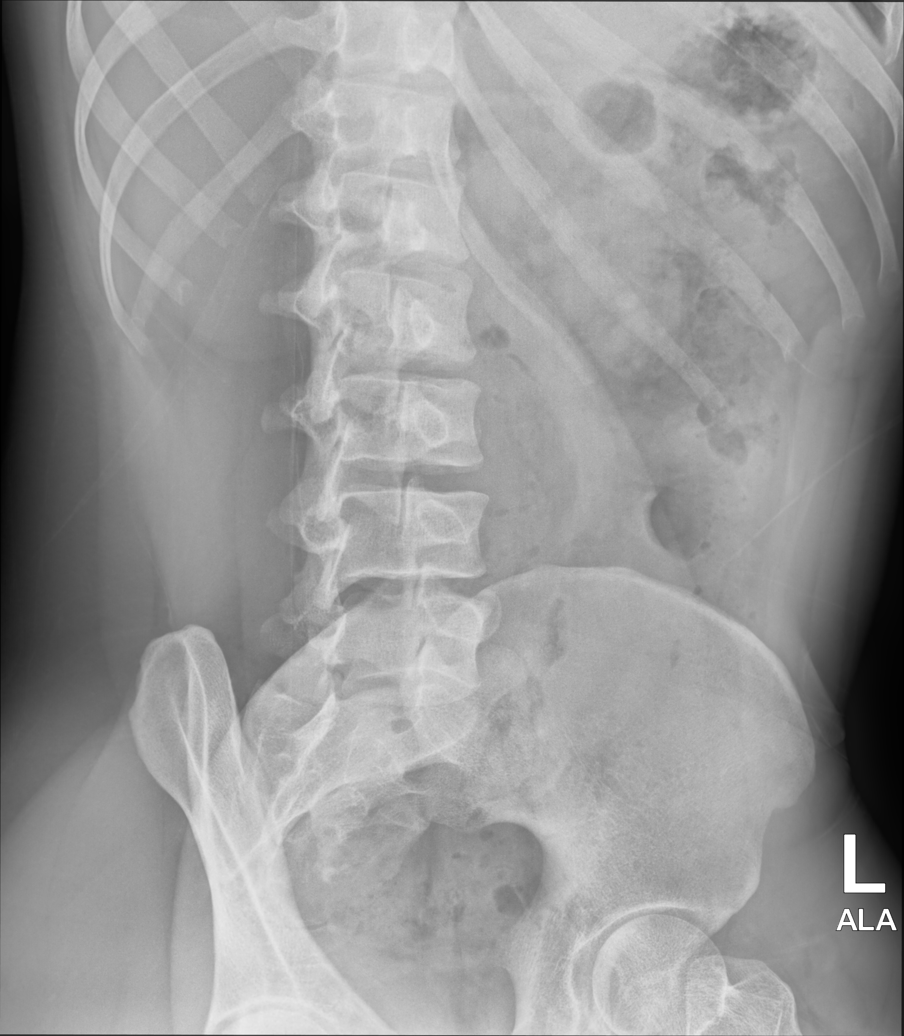

[lumbar spine lat (1 of 2)]
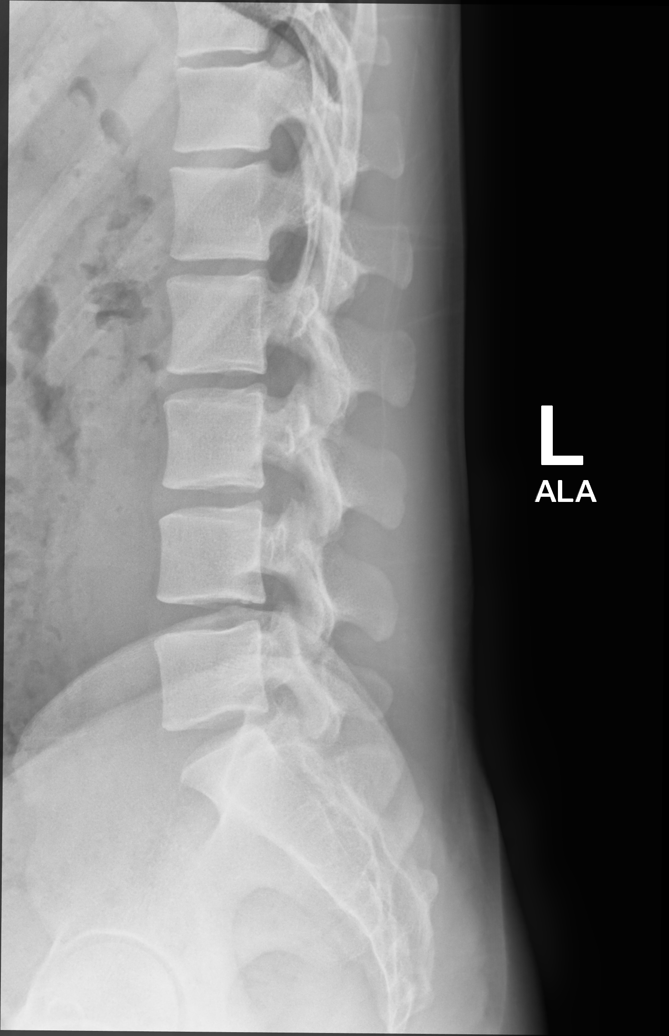

[lumbar spine lat (2 of 2)]
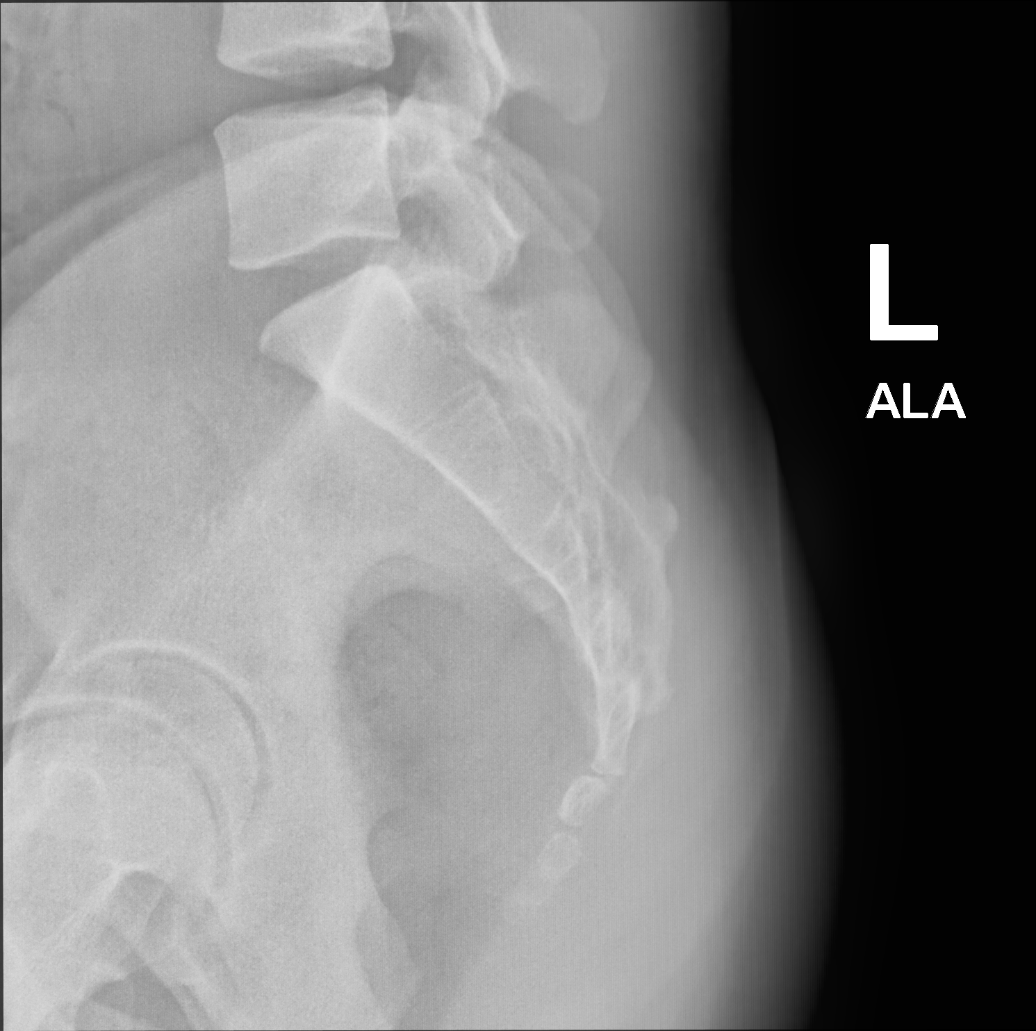

[5 of 5 positions shown; findings below may reference images not displayed]

FINDINGS: Five non-rib-bearing lumbar vertebra. Mild straightening of normal
lordosis. Trace retrolisthesis of L5 on S1. Vertebral body heights
are normal. The posterior elements are intact. Disc spaces are
preserved. No fracture, pars defects, or focal bone abnormality.
Sacroiliac joints are symmetric and normal.
IMPRESSION: Mild straightening of normal lordosis can be seen with muscle spasm.
Trace retrolisthesis of L5 on S1.

## 2023-06-13 DIAGNOSIS — F4322 Adjustment disorder with anxiety: Secondary | ICD-10-CM | POA: Diagnosis not present

## 2023-06-21 DIAGNOSIS — F4322 Adjustment disorder with anxiety: Secondary | ICD-10-CM | POA: Diagnosis not present

## 2023-07-02 DIAGNOSIS — F4322 Adjustment disorder with anxiety: Secondary | ICD-10-CM | POA: Diagnosis not present

## 2023-07-09 DIAGNOSIS — F4322 Adjustment disorder with anxiety: Secondary | ICD-10-CM | POA: Diagnosis not present

## 2023-07-26 DIAGNOSIS — F4322 Adjustment disorder with anxiety: Secondary | ICD-10-CM | POA: Diagnosis not present

## 2023-08-02 DIAGNOSIS — F4322 Adjustment disorder with anxiety: Secondary | ICD-10-CM | POA: Diagnosis not present

## 2024-06-05 ENCOUNTER — Ambulatory Visit: Admitting: Gastroenterology
